# Patient Record
Sex: Female | Born: 1935 | Race: White | Hispanic: No | Smoking: Never smoker
Health system: Southern US, Community
[De-identification: ages and names within clinical notes are randomized; demographics above are authoritative.]

## PROBLEM LIST (undated history)

## (undated) DIAGNOSIS — F324 Major depressive disorder, single episode, in partial remission: Secondary | ICD-10-CM

## (undated) DIAGNOSIS — R531 Weakness: Secondary | ICD-10-CM

## (undated) DIAGNOSIS — Z950 Presence of cardiac pacemaker: Secondary | ICD-10-CM

## (undated) DIAGNOSIS — I10 Essential (primary) hypertension: Secondary | ICD-10-CM

## (undated) DIAGNOSIS — F0390 Unspecified dementia without behavioral disturbance: Secondary | ICD-10-CM

## (undated) DIAGNOSIS — R609 Edema, unspecified: Secondary | ICD-10-CM

## (undated) DIAGNOSIS — I4891 Unspecified atrial fibrillation: Secondary | ICD-10-CM

## (undated) HISTORY — DX: Unspecified dementia, unspecified severity, without behavioral disturbance, psychotic disturbance, mood disturbance, and anxiety: F03.90

## (undated) HISTORY — PX: GALLBLADDER SURGERY: SHX652

## (undated) HISTORY — DX: Unspecified atrial fibrillation: I48.91

## (undated) HISTORY — DX: Presence of cardiac pacemaker: Z95.0

## (undated) HISTORY — DX: Essential (primary) hypertension: I10

## (undated) HISTORY — DX: Edema, unspecified: R60.9

## (undated) HISTORY — PX: TONSILLECTOMY: SUR1361

## (undated) HISTORY — DX: Weakness: R53.1

## (undated) HISTORY — DX: Major depressive disorder, single episode, in partial remission: F32.4

## (undated) HISTORY — PX: PACEMAKER INSERTION: SHX728

---

## 2016-04-19 ENCOUNTER — Encounter: Payer: Self-pay | Admitting: Internal Medicine

## 2016-05-03 ENCOUNTER — Ambulatory Visit (INDEPENDENT_AMBULATORY_CARE_PROVIDER_SITE_OTHER): Payer: Medicare Other | Admitting: Internal Medicine

## 2016-05-03 ENCOUNTER — Encounter: Payer: Self-pay | Admitting: Internal Medicine

## 2016-05-03 VITALS — BP 162/100 | HR 77 | Ht 61.0 in | Wt 169.6 lb

## 2016-05-03 DIAGNOSIS — I480 Paroxysmal atrial fibrillation: Secondary | ICD-10-CM

## 2016-05-03 DIAGNOSIS — Z950 Presence of cardiac pacemaker: Secondary | ICD-10-CM

## 2016-05-03 DIAGNOSIS — I495 Sick sinus syndrome: Secondary | ICD-10-CM

## 2016-05-03 LAB — BASIC METABOLIC PANEL
BUN: 20 mg/dL (ref 7–25)
CALCIUM: 9.5 mg/dL (ref 8.6–10.4)
CHLORIDE: 100 mmol/L (ref 98–110)
CO2: 33 mmol/L — AB (ref 20–31)
CREATININE: 1.01 mg/dL — AB (ref 0.60–0.88)
Glucose, Bld: 143 mg/dL — ABNORMAL HIGH (ref 65–99)
Potassium: 3.9 mmol/L (ref 3.5–5.3)
SODIUM: 142 mmol/L (ref 135–146)

## 2016-05-03 NOTE — Patient Instructions (Addendum)
Medication Instructions:  Your physician recommends that you continue on your current medications as directed. Please refer to the Current Medication list given to you today.   Labwork: TODAY: BMP   Testing/Procedures: None Ordered   Follow-Up: Your physician wants you to follow-up in: 1 year with Dr. Ladona Ridgelaylor. You will receive a reminder letter in the mail two months in advance. If you don't receive a letter, please call our office to schedule the follow-up appointment.  Remote monitoring is used to monitor your ICD from home. This monitoring reduces the number of office visits required to check your device to one time per year. It allows us to keep an eye on the functioning of your device to ensure it is working properly. You are scheduled for a device check from home on 08/02/16. You may send your transmission at any time that day. If you have a wireless device, the transmission will be sent automatically. After your physician reviews your transmission, you will receive a postcard with your next transmission date.    Any Other Special Instructions Will Be Listed Below (If Applicable).     If you need a refill on your cardiac medications before your next appointment, please call your pharmacy.

## 2016-05-03 NOTE — Progress Notes (Signed)
HPI Gwendolyn Brown is referred today by Dr. Tenny Craw for ongoing evaluation and management of her PPM and atrial fib. She is a pleasant 82 yo woman with dementia, HTN, chronic atrial fib and complete heart block s/p PPM insertion. She also has some class 2 diastolic heart failure symptoms. No syncope. She is bothered by periodic epistaxis. Allergies  Allergen Reactions  . Codeine      Current Outpatient Prescriptions  Medication Sig Dispense Refill  . apixaban (ELIQUIS) 5 MG TABS tablet Take 5 mg by mouth 2 (two) times daily.    Marland Kitchen b complex vitamins tablet Take 1 tablet by mouth daily.    . metoprolol succinate (TOPROL-XL) 100 MG 24 hr tablet Take 100 mg by mouth 3 (three) times daily. Take 2 TABLET IN THE AM, TAKE 2 TABLET IN THE EVENING BY MOUTH.    . traZODone (DESYREL) 50 MG tablet Take 50 mg by mouth at bedtime.    . triamterene-hydrochlorothiazide (MAXZIDE-25) 37.5-25 MG tablet Take 1 tablet by mouth daily.    Marland Kitchen venlafaxine XR (EFFEXOR-XR) 37.5 MG 24 hr capsule Take 37.5 mg by mouth daily with breakfast.     No current facility-administered medications for this visit.      Past Medical History:  Diagnosis Date  . Atrial fibrillation (HCC)   . Dementia   . Depression, major, in partial remission (HCC)   . Edema   . Hypertension   . Pacemaker   . Weakness generalized     ROS:   All systems reviewed and negative except as noted in the HPI.   Past Surgical History:  Procedure Laterality Date  . GALLBLADDER SURGERY    . PACEMAKER INSERTION    . TONSILLECTOMY       Family History  Problem Relation Age of Onset  . Hypertension Father   . Hypertension Sister      Social History   Social History  . Marital status: Widowed    Spouse name: N/A  . Number of children: 2  . Years of education: COLLEGE   Occupational History  . RETIRED    Social History Main Topics  . Smoking status: Never Smoker  . Smokeless tobacco: Never Used  . Alcohol use Yes  . Drug  use: No  . Sexual activity: No   Other Topics Concern  . Not on file   Social History Narrative  . No narrative on file     BP (!) 162/100   Pulse 77   Physical Exam:  Well appearing elderly woman, a bit diskempt, NAD HEENT: Unremarkable Neck:  6 cm JVD, no thyromegally Lymphatics:  No adenopathy Back:  No CVA tenderness Lungs:  Clear with no wheezes HEART:  Regular rate rhythm, no murmurs, no rubs, no clicks Abd:  soft, positive bowel sounds, no organomegally, no rebound, no guarding Ext:  2 plus pulses, no edema, no cyanosis, no clubbing Skin:  No rashes no nodules Neuro:  CN II through XII intact, motor grossly intact  EKG - atrial fib with ventricular pacing  DEVICE  Normal device function.  See PaceArt for details. VVIR pacing  Assess/Plan: 1. Chronic atrial fib - her rates are well controlled. She is tolerating her anti-coagulation. 2. CHB - she is s/p PPM, and is asymptomatic 3. PPM - her medtronic device is reprogrammed today to VVIR as she has chronic atrial fib. 4. Diastolic heart failure - she is encouraged to maintain a low sodium diet. She keeps her legs elevated  to prevent edema formation.  Gwendolyn Brown,M.D.

## 2016-05-13 LAB — CUP PACEART INCLINIC DEVICE CHECK
Battery Impedance: 604 Ohm
Battery Voltage: 2.79 V
Brady Statistic RV Percent Paced: 100 %
Date Time Interrogation Session: 20170911143238
Implantable Lead Implant Date: 20130123
Implantable Lead Location: 753860
Lead Channel Impedance Value: 622 Ohm
Lead Channel Setting Pacing Amplitude: 2.5 V
Lead Channel Setting Pacing Pulse Width: 0.4 ms
Lead Channel Setting Sensing Sensitivity: 4 mV
MDC IDC LEAD IMPLANT DT: 20130123
MDC IDC LEAD LOCATION: 753859
MDC IDC MSMT BATTERY REMAINING LONGEVITY: 70 mo
MDC IDC MSMT LEADCHNL RV IMPEDANCE VALUE: 513 Ohm
MDC IDC MSMT LEADCHNL RV PACING THRESHOLD AMPLITUDE: 0.75 V
MDC IDC MSMT LEADCHNL RV PACING THRESHOLD PULSEWIDTH: 0.4 ms

## 2016-05-16 ENCOUNTER — Emergency Department (HOSPITAL_COMMUNITY): Payer: Medicare Other

## 2016-05-16 ENCOUNTER — Encounter (HOSPITAL_COMMUNITY): Payer: Self-pay | Admitting: Emergency Medicine

## 2016-05-16 ENCOUNTER — Inpatient Hospital Stay (HOSPITAL_COMMUNITY)
Admission: EM | Admit: 2016-05-16 | Discharge: 2016-05-21 | DRG: 194 | Disposition: A | Discharge status: Skilled Nursing Facility | Payer: Medicare Other | Attending: Internal Medicine | Admitting: Internal Medicine

## 2016-05-16 DIAGNOSIS — F324 Major depressive disorder, single episode, in partial remission: Secondary | ICD-10-CM | POA: Diagnosis present

## 2016-05-16 DIAGNOSIS — J189 Pneumonia, unspecified organism: Principal | ICD-10-CM | POA: Diagnosis present

## 2016-05-16 DIAGNOSIS — I272 Other secondary pulmonary hypertension: Secondary | ICD-10-CM | POA: Diagnosis present

## 2016-05-16 DIAGNOSIS — R8271 Bacteriuria: Secondary | ICD-10-CM

## 2016-05-16 DIAGNOSIS — F0390 Unspecified dementia without behavioral disturbance: Secondary | ICD-10-CM

## 2016-05-16 DIAGNOSIS — Z8249 Family history of ischemic heart disease and other diseases of the circulatory system: Secondary | ICD-10-CM

## 2016-05-16 DIAGNOSIS — R609 Edema, unspecified: Secondary | ICD-10-CM

## 2016-05-16 DIAGNOSIS — R062 Wheezing: Secondary | ICD-10-CM

## 2016-05-16 DIAGNOSIS — R262 Difficulty in walking, not elsewhere classified: Secondary | ICD-10-CM

## 2016-05-16 DIAGNOSIS — Z23 Encounter for immunization: Secondary | ICD-10-CM

## 2016-05-16 DIAGNOSIS — I159 Secondary hypertension, unspecified: Secondary | ICD-10-CM

## 2016-05-16 DIAGNOSIS — I69991 Dysphagia following unspecified cerebrovascular disease: Secondary | ICD-10-CM

## 2016-05-16 DIAGNOSIS — I495 Sick sinus syndrome: Secondary | ICD-10-CM

## 2016-05-16 DIAGNOSIS — R0600 Dyspnea, unspecified: Secondary | ICD-10-CM | POA: Diagnosis present

## 2016-05-16 DIAGNOSIS — I10 Essential (primary) hypertension: Secondary | ICD-10-CM

## 2016-05-16 DIAGNOSIS — R1313 Dysphagia, pharyngeal phase: Secondary | ICD-10-CM | POA: Diagnosis present

## 2016-05-16 DIAGNOSIS — Z66 Do not resuscitate: Secondary | ICD-10-CM | POA: Diagnosis present

## 2016-05-16 DIAGNOSIS — I4891 Unspecified atrial fibrillation: Secondary | ICD-10-CM

## 2016-05-16 DIAGNOSIS — B9620 Unspecified Escherichia coli [E. coli] as the cause of diseases classified elsewhere: Secondary | ICD-10-CM | POA: Diagnosis present

## 2016-05-16 DIAGNOSIS — I313 Pericardial effusion (noninflammatory): Secondary | ICD-10-CM | POA: Diagnosis present

## 2016-05-16 DIAGNOSIS — W19XXXA Unspecified fall, initial encounter: Secondary | ICD-10-CM | POA: Diagnosis present

## 2016-05-16 DIAGNOSIS — N390 Urinary tract infection, site not specified: Secondary | ICD-10-CM | POA: Diagnosis present

## 2016-05-16 DIAGNOSIS — E876 Hypokalemia: Secondary | ICD-10-CM | POA: Diagnosis present

## 2016-05-16 DIAGNOSIS — I160 Hypertensive urgency: Secondary | ICD-10-CM

## 2016-05-16 DIAGNOSIS — I482 Chronic atrial fibrillation: Secondary | ICD-10-CM | POA: Diagnosis present

## 2016-05-16 DIAGNOSIS — R296 Repeated falls: Secondary | ICD-10-CM | POA: Diagnosis present

## 2016-05-16 DIAGNOSIS — Z79899 Other long term (current) drug therapy: Secondary | ICD-10-CM

## 2016-05-16 DIAGNOSIS — Z950 Presence of cardiac pacemaker: Secondary | ICD-10-CM

## 2016-05-16 DIAGNOSIS — R531 Weakness: Secondary | ICD-10-CM | POA: Diagnosis present

## 2016-05-16 DIAGNOSIS — Z7901 Long term (current) use of anticoagulants: Secondary | ICD-10-CM

## 2016-05-16 DIAGNOSIS — R5381 Other malaise: Secondary | ICD-10-CM

## 2016-05-16 LAB — CBC WITH DIFFERENTIAL/PLATELET
Basophils Absolute: 0 10*3/uL (ref 0.0–0.1)
Basophils Relative: 0 %
EOS ABS: 0.1 10*3/uL (ref 0.0–0.7)
EOS PCT: 2 %
HCT: 43.1 % (ref 36.0–46.0)
HEMOGLOBIN: 13.8 g/dL (ref 12.0–15.0)
LYMPHS ABS: 1.2 10*3/uL (ref 0.7–4.0)
Lymphocytes Relative: 13 %
MCH: 28.5 pg (ref 26.0–34.0)
MCHC: 32 g/dL (ref 30.0–36.0)
MCV: 89 fL (ref 78.0–100.0)
MONOS PCT: 8 %
Monocytes Absolute: 0.7 10*3/uL (ref 0.1–1.0)
NEUTROS PCT: 77 %
Neutro Abs: 7.3 10*3/uL (ref 1.7–7.7)
Platelets: 275 10*3/uL (ref 150–400)
RBC: 4.84 MIL/uL (ref 3.87–5.11)
RDW: 16.4 % — ABNORMAL HIGH (ref 11.5–15.5)
WBC: 9.4 10*3/uL (ref 4.0–10.5)

## 2016-05-16 LAB — COMPREHENSIVE METABOLIC PANEL
ALK PHOS: 81 U/L (ref 38–126)
ALT: 23 U/L (ref 14–54)
ANION GAP: 11 (ref 5–15)
AST: 25 U/L (ref 15–41)
Albumin: 3.5 g/dL (ref 3.5–5.0)
BILIRUBIN TOTAL: 1.2 mg/dL (ref 0.3–1.2)
BUN: 14 mg/dL (ref 6–20)
CALCIUM: 9.3 mg/dL (ref 8.9–10.3)
CO2: 27 mmol/L (ref 22–32)
CREATININE: 0.85 mg/dL (ref 0.44–1.00)
Chloride: 101 mmol/L (ref 101–111)
Glucose, Bld: 134 mg/dL — ABNORMAL HIGH (ref 65–99)
Potassium: 3.8 mmol/L (ref 3.5–5.1)
SODIUM: 139 mmol/L (ref 135–145)
TOTAL PROTEIN: 6.6 g/dL (ref 6.5–8.1)

## 2016-05-16 LAB — BRAIN NATRIURETIC PEPTIDE: B NATRIURETIC PEPTIDE 5: 341.5 pg/mL — AB (ref 0.0–100.0)

## 2016-05-16 LAB — URINALYSIS, ROUTINE W REFLEX MICROSCOPIC
BILIRUBIN URINE: NEGATIVE
GLUCOSE, UA: NEGATIVE mg/dL
Ketones, ur: NEGATIVE mg/dL
Nitrite: POSITIVE — AB
Protein, ur: NEGATIVE mg/dL
SPECIFIC GRAVITY, URINE: 1.016 (ref 1.005–1.030)
pH: 5.5 (ref 5.0–8.0)

## 2016-05-16 LAB — URINE MICROSCOPIC-ADD ON

## 2016-05-16 MED ORDER — ONDANSETRON HCL 4 MG/2ML IJ SOLN: 4.0000 mg | Freq: Four times a day (QID) | INTRAMUSCULAR | Status: DC | PRN

## 2016-05-16 MED ORDER — TRAZODONE HCL 50 MG PO TABS
50.0000 mg | ORAL_TABLET | Freq: Every day | ORAL | Status: DC
  Administered 2016-05-16 – 2016-05-20 (×5): 50 mg via ORAL
  Filled 2016-05-16 (×6): qty 1

## 2016-05-16 MED ORDER — SODIUM CHLORIDE 0.9 % IV SOLN: 250.0000 mL | INTRAVENOUS | Status: DC | PRN

## 2016-05-16 MED ORDER — DOCUSATE SODIUM 100 MG PO CAPS: 100.0000 mg | ORAL_CAPSULE | Freq: Every day | ORAL | Status: DC | PRN

## 2016-05-16 MED ORDER — VENLAFAXINE HCL ER 37.5 MG PO CP24
37.5000 mg | ORAL_CAPSULE | Freq: Every day | ORAL | Status: DC
  Administered 2016-05-17 – 2016-05-21 (×5): 37.5 mg via ORAL
  Filled 2016-05-16 (×5): qty 1

## 2016-05-16 MED ORDER — OXYCODONE HCL 5 MG PO TABS
5.0000 mg | ORAL_TABLET | ORAL | Status: DC | PRN
  Administered 2016-05-18: 5 mg via ORAL
  Filled 2016-05-16: qty 1

## 2016-05-16 MED ORDER — TRIAMTERENE-HCTZ 37.5-25 MG PO TABS
1.0000 | ORAL_TABLET | Freq: Every day | ORAL | Status: DC
  Administered 2016-05-17 – 2016-05-21 (×5): 1 via ORAL
  Filled 2016-05-16 (×5): qty 1

## 2016-05-16 MED ORDER — FUROSEMIDE 10 MG/ML IJ SOLN
40.0000 mg | Freq: Every day | INTRAMUSCULAR | Status: DC
  Administered 2016-05-16 – 2016-05-20 (×5): 40 mg via INTRAVENOUS
  Filled 2016-05-16 (×6): qty 4

## 2016-05-16 MED ORDER — SODIUM CHLORIDE 0.9% FLUSH
3.0000 mL | Freq: Two times a day (BID) | INTRAVENOUS | Status: DC
  Administered 2016-05-16 – 2016-05-21 (×8): 3 mL via INTRAVENOUS

## 2016-05-16 MED ORDER — SODIUM CHLORIDE 0.9% FLUSH: 3.0000 mL | INTRAVENOUS | Status: DC | PRN

## 2016-05-16 MED ORDER — LISINOPRIL 5 MG PO TABS
5.0000 mg | ORAL_TABLET | Freq: Every day | ORAL | Status: DC
  Administered 2016-05-16 – 2016-05-20 (×5): 5 mg via ORAL
  Filled 2016-05-16 (×5): qty 1

## 2016-05-16 MED ORDER — INFLUENZA VAC SPLIT QUAD 0.5 ML IM SUSY
0.5000 mL | PREFILLED_SYRINGE | INTRAMUSCULAR | Status: AC
  Administered 2016-05-17: 0.5 mL via INTRAMUSCULAR
  Filled 2016-05-16: qty 0.5

## 2016-05-16 MED ORDER — ACETAMINOPHEN 325 MG PO TABS
650.0000 mg | ORAL_TABLET | ORAL | Status: DC | PRN
  Administered 2016-05-18: 650 mg via ORAL
  Filled 2016-05-16: qty 2

## 2016-05-16 MED ORDER — METOPROLOL SUCCINATE ER 50 MG PO TB24
200.0000 mg | ORAL_TABLET | Freq: Two times a day (BID) | ORAL | Status: DC
  Administered 2016-05-16 – 2016-05-21 (×10): 200 mg via ORAL
  Filled 2016-05-16 (×10): qty 4

## 2016-05-16 MED ORDER — APIXABAN 5 MG PO TABS
5.0000 mg | ORAL_TABLET | Freq: Two times a day (BID) | ORAL | Status: DC
  Administered 2016-05-16 – 2016-05-21 (×10): 5 mg via ORAL
  Filled 2016-05-16 (×10): qty 1

## 2016-05-16 NOTE — ED Triage Notes (Signed)
Per EMS-states she fell in bathroom this am and couldn't get up-states increased weakness for past 3 days-states saw cardiologist last week and had pacemaker checked

## 2016-05-16 NOTE — H&P (Signed)
History and Physical    Gwendolyn Brown:[id number] DOB: 1930-07-29 DOA: 05/16/2016  PCP:  Duane Lope, MD Patient coming from: Home  Chief Complaint: Dyspnea  HPI: Gwendolyn Brown is a 82 y.o. female with medical history significant of hypertension, atrial fibrillation on chronic anticoagulation, sick sinus syndrome status post pacer, dementia, depression. About 4 days ago, patient started having mild to moderate dyspnea. She has had associated nonproductive coughing and increasing weakness. She has not taken anything to help with her symptoms. Dyspnea continued to worsen, in addition to patient's increasing weakness, so EMS was called for transport to the [facility] department.  ED Course: Vitals: Hypertensive with systolic blood pressures in 200s over as THE 100s Labs: Urinalysis suggest bacteria. BNP elevated at 341. Imaging: Chest x-ray was concerning for possible pneumonia but more likely low lung volumes Medications/Course: EKG obtained in the ED showing a paced rhythm. No other interventions done.  Review of Systems: Review of Systems  Constitutional: Negative for chills, fever and malaise/fatigue.  Eyes: Negative for blurred vision.  Respiratory: Positive for cough, shortness of breath and wheezing. Negative for hemoptysis and sputum production.   Cardiovascular: Negative for chest pain, palpitations, orthopnea, leg swelling and PND.  Gastrointestinal: Negative for abdominal pain, constipation, diarrhea, heartburn, nausea and vomiting.  Genitourinary: Negative for dysuria, flank pain, frequency, hematuria and urgency.  Neurological: Positive for weakness. Negative for loss of consciousness and headaches.  All other systems reviewed and are negative.   Past Medical History:  Diagnosis Date  . Atrial fibrillation (HCC)   . Dementia   . Depression, major, in partial remission (HCC)   . Edema   . Hypertension   . Pacemaker   . Weakness generalized     Past Surgical History:    Procedure Laterality Date  . GALLBLADDER SURGERY    . PACEMAKER INSERTION    . TONSILLECTOMY       reports that she has never smoked. She has never used smokeless tobacco. She reports that she drinks alcohol. She reports that she does not use drugs.  Allergies  Allergen Reactions  . Codeine Rash    Family History  Problem Relation Age of Onset  . Hypertension Father   . Hypertension Sister     Prior to Admission medications   Medication Sig Start Date End Date Taking? Authorizing Provider  acetaminophen (TYLENOL) 500 MG tablet Take 500 mg by mouth every 6 (six) hours as needed for mild pain.   Yes Historical Provider, MD  apixaban (ELIQUIS) 5 MG TABS tablet Take 5 mg by mouth 2 (two) times daily.   Yes Historical Provider, MD  b complex vitamins tablet Take 1 tablet by mouth daily.   Yes Historical Provider, MD  docusate sodium (COLACE) 100 MG capsule Take 100 mg by mouth daily as needed for mild constipation.   Yes Historical Provider, MD  metoprolol succinate (TOPROL-XL) 100 MG 24 hr tablet Take 100 mg by mouth 3 (three) times daily. Take 2 TABLET IN THE AM, TAKE 2 TABLET IN THE EVENING BY MOUTH.   Yes Historical Provider, MD  oxyCODONE (OXY IR/ROXICODONE) 5 MG immediate release tablet Take 5 mg by mouth every 4 (four) hours as needed for severe pain or breakthrough pain.   Yes Historical Provider, MD  traZODone (DESYREL) 50 MG tablet Take 50 mg by mouth at bedtime.   Yes Historical Provider, MD  triamterene-hydrochlorothiazide (MAXZIDE-25) 37.5-25 MG tablet Take 1 tablet by mouth daily.   Yes Historical Provider, MD  venlafaxine XR (EFFEXOR-XR)  37.5 MG 24 hr capsule Take 37.5 mg by mouth daily with breakfast.   Yes Historical Provider, MD    Physical Exam: Vitals:   05/16/16 1000 05/16/16 1030 05/16/16 1039 05/16/16 1100  BP: (!) 203/109 (!) 199/113 (!) 199/113 (!) 205/103  Pulse:  79 74 71  Resp: 21 18 24 24   Temp:      SpO2:  95% 98% 92%  Weight:      Height:          Constitutional: NAD, calm, comfortable Eyes: PERRL, lids and conjunctivae normal ENMT: Mucous membranes are moist. Posterior pharynx clear of any exudate or lesions. Poor dentition.  Neck: normal, supple, no masses, no thyromegaly Respiratory: Diminished breath sounds bilaterally with crackles heard. Normal respiratory effort. No accessory muscle use.  Cardiovascular: Regular rate and rhythm, no murmurs / rubs / gallops. 2+ extremity edema. 2+ pedal pulses. No carotid bruits.  Abdomen: no tenderness, no masses palpated. No hepatosplenomegaly. Bowel sounds positive.  Musculoskeletal: no clubbing / cyanosis. No joint deformity upper and lower extremities. Good ROM, no contractures. Normal muscle tone.  Skin: small ecchymotic rashes on arms, no cyanosis Neurologic: CN 2-12 grossly intact. Sensation intact, DTR diminished bilaterally. Strength 4/5 in all 4.  Psychiatric: Normal judgment and insight. Alert and oriented x 3. Normal mood.   Labs on Admission: I have personally reviewed following labs and imaging studies  CBC:  Recent Labs Lab 05/16/16 0839  WBC 9.4  NEUTROABS 7.3  HGB 13.8  HCT 43.1  MCV 89.0  PLT 275   Basic Metabolic Panel:  Recent Labs Lab 05/16/16 0839  NA 139  K 3.8  CL 101  CO2 27  GLUCOSE 134*  BUN 14  CREATININE 0.85  CALCIUM 9.3   GFR: Estimated Creatinine Clearance: 52 mL/min (by C-G formula based on SCr of 0.85 mg/dL). Liver Function Tests:  Recent Labs Lab 05/16/16 0839  AST 25  ALT 23  ALKPHOS 81  BILITOT 1.2  PROT 6.6  ALBUMIN 3.5   Urine analysis:    Component Value Date/Time   COLORURINE YELLOW 05/16/2016 0810   APPEARANCEUR CLOUDY (A) 05/16/2016 0810   LABSPEC 1.016 05/16/2016 0810   PHURINE 5.5 05/16/2016 0810   GLUCOSEU NEGATIVE 05/16/2016 0810   HGBUR MODERATE (A) 05/16/2016 0810   BILIRUBINUR NEGATIVE 05/16/2016 0810   KETONESUR NEGATIVE 05/16/2016 0810   PROTEINUR NEGATIVE 05/16/2016 0810   NITRITE POSITIVE (A)  05/16/2016 0810   LEUKOCYTESUR MODERATE (A) 05/16/2016 0810   Radiological Exams on Admission: Dg Chest 2 View  Result Date: 05/16/2016 CLINICAL DATA:  Patient fell this morning. Complaining of of increased weakness and dyspnea for the past 3 days. EXAM: CHEST  2 VIEW COMPARISON:  02/23/2016 FINDINGS: Lung volumes are low suggesting shallow inspiration. Crowding of the interstitial lung markings and bibasilar atelectasis result. Slight increasing airspace opacity at the right lung base cannot entirely exclude a small infiltrate. No overt pulmonary edema nor effusion. The heart is normal in size. Left-sided pacemaker apparatus projects over the costophrenic angle with right atrial and right ventricular leads in place. Aorta is not aneurysmal. There is osteoarthritic joint space narrowing of both glenohumeral joints with inferomedial osteophytes off the humeral heads. Dystrophic calcifications are seen along the course of the rotator cuff bilaterally. IMPRESSION: Confluent area of airspace opacity at the right lung base for which a small pneumonia cannot be entirely excluded but findings are believed to be more likely from shallow inspiration and low lung volumes. Bilateral calcific rotator cuff tendinopathy.  Electronically Signed   By: Tollie Eth M.D.   On: 05/16/2016 08:38    EKG: Independently reviewed. Ventricular-paced  Assessment/Plan Principal Problem:   Dyspnea Active Problems:   Atrial fibrillation (HCC)   Dementia   Pacemaker   Hypertension   Sick sinus syndrome (HCC)   Hypertensive urgency   Asymptomatic bacteriuria  Dyspnea Weaning towards heart failure exacerbation. Chest x-ray not very significant for a pneumonia. No associated chest pain. PE not time differential, especially since patient is on chronic anticoagulation. Patient is up 11 pounds from recent cardiologist visit 2 weeks ago. BNP is elevated. -Lasix 40 mg IV daily -Daily weights, strict ins and outs -Will start  lisinopril 5 mg daily. We'll need to watch potassium as patient is on a potassium sparing diuretic -Continue metoprolol -Echocardiogram -TSH  Hypertensive urgency Essential Hypertension Hypervolemic state could be contributing to patient's blood pressure. Patient has taken her blood pressure medications today -Continue metoprolol and Maxzide -Lisinopril as above -Diuresis as above  Atrial fibrillation -Continue Eliquis -Continue metoprolol  Sick sinus syndrome Patient currently has pacer  Depression Continue Effexor  Dementia -Watch for behavioral changes  Asymptomatically bacteriuria -Obtain urine culture -Hold off on antibiotic therapy  DVT prophylaxis: Eliquis  Code Status: DO NOT RESUSCITATE  Family Communication: Daughter at bedside  Disposition Plan: Likely discharge home in 2-3 days  Consults called: None  Admission status: Inpatient, telemetry   Jacquelin Hawking, MD [facility] Pager [phone]  If 7PM-7AM, please contact night-coverage [email or web address] Password [email or web address]  05/16/2016, 11:58 AM

## 2016-05-16 NOTE — ED Provider Notes (Signed)
[facility]-EMERGENCY DEPT Provider Note   CSN: [id number] Arrival date & time: 05/16/16  0751     History   Chief Complaint Chief Complaint  Patient presents with  . Shortness of Breath    HPI Gwendolyn Brown is a 82 y.o. female.  She presents for evaluation of shortness of breath. She is unable stay, the severity, or how long it's been present.  Level V caveat- poor historian  HPI  Past Medical History:  Diagnosis Date  . Atrial fibrillation (HCC)   . Dementia   . Depression, major, in partial remission (HCC)   . Edema   . Hypertension   . Pacemaker   . Weakness generalized     There are no active problems to display for this patient.   Past Surgical History:  Procedure Laterality Date  . GALLBLADDER SURGERY    . PACEMAKER INSERTION    . TONSILLECTOMY      OB History    No data available       Home Medications    Prior to Admission medications   Medication Sig Start Date End Date Taking? Authorizing Provider  apixaban (ELIQUIS) 5 MG TABS tablet Take 5 mg by mouth 2 (two) times daily.    Historical Provider, MD  b complex vitamins tablet Take 1 tablet by mouth daily.    Historical Provider, MD  metoprolol succinate (TOPROL-XL) 100 MG 24 hr tablet Take 100 mg by mouth 3 (three) times daily. Take 2 TABLET IN THE AM, TAKE 2 TABLET IN THE EVENING BY MOUTH.    Historical Provider, MD  traZODone (DESYREL) 50 MG tablet Take 50 mg by mouth at bedtime.    Historical Provider, MD  triamterene-hydrochlorothiazide (MAXZIDE-25) 37.5-25 MG tablet Take 1 tablet by mouth daily.    Historical Provider, MD  venlafaxine XR (EFFEXOR-XR) 37.5 MG 24 hr capsule Take 37.5 mg by mouth daily with breakfast.    Historical Provider, MD    Family History Family History  Problem Relation Age of Onset  . Hypertension Father   . Hypertension Sister     Social History Social History  Substance Use Topics  . Smoking status: Never Smoker  . Smokeless tobacco: Never Used  . Alcohol use  Yes     Allergies   Codeine   Review of Systems Review of Systems  Unable to perform ROS: Mental status change     Physical Exam Updated Vital Signs There were no vitals taken for this visit.  Physical Exam  Constitutional: She appears well-developed.  Elderly, frail  HENT:  Head: Normocephalic and atraumatic.  Eyes: Conjunctivae and EOM are normal. Pupils are equal, round, and reactive to light.  Neck: Normal range of motion and phonation normal. Neck supple.  Cardiovascular: Normal rate and regular rhythm.   No JVD.  Pulmonary/Chest: Effort normal. She exhibits no tenderness.  Somewhat diminished air movement bilaterally with scattered end-expiratory wheezes. Breath sounds diminished at the bases.  Abdominal: Soft. She exhibits no distension. There is no tenderness. There is no guarding.  Musculoskeletal: Normal range of motion. She exhibits edema (1-2+ bilateral lower legs.).  Neurological: She is alert. No cranial nerve deficit. She exhibits normal muscle tone.  Skin: Skin is warm and dry.  Psychiatric: She has a normal mood and affect. Her behavior is normal.  Nursing note and vitals reviewed.    ED Treatments / Results  Labs (all labs ordered are listed, but only abnormal results are displayed) Labs Reviewed  CBC WITH DIFFERENTIAL/PLATELET  URINALYSIS, ROUTINE  W REFLEX MICROSCOPIC (NOT AT [facility])  COMPREHENSIVE METABOLIC PANEL  BRAIN NATRIURETIC PEPTIDE    EKG  EKG Interpretation None       Radiology No results found.  Procedures Procedures (including critical care time)  Medications Ordered in ED Medications - No data to display   Initial Impression / Assessment and Plan / ED Course  I have reviewed the triage vital signs and the nursing notes.  Pertinent labs & imaging results that were available during my care of the patient were reviewed by me and considered in my medical decision making (see chart for details).  Clinical Course  Value  Comment By Time   The patient's daughter is here now, with whom she lives. She gives an extensive description of the patient's decline over the last 6 months. This decline has resulted in her falling from a chair twice within the last week. Each time she required assistance to get up, but there was no injury. The patient has been receiving PT and OT, at her home, and they plan on discharging her next week. The daughter states that the patient has been increasingly short of breath over the last 3 weeks, has an occasional cough, and is having more swelling in her legs. The patient is eating well. Reportedly, the patient has blood pressure is ranging between 118 and 200. The patient is DO NOT RESUSCITATE. The patient is having "cognition problems", described as difficulty initiating movement, confusion, inability to help herself. She is able to walk and uses various assistive devices, including canes and a walker. The patient is reportedly taking her medications as prescribed. She saw a urgent care physician, 5 days ago for shortness of breath and was told to work on coughing to clear her lungs. She follows closely with her PCP and cardiology services. The patient has not had any urinary frequency, dysuria or hematuria by report. The daughter states the patient cannot be managed at her current living setting, because of her weakness and falling and progressive shortness of breath. The patient's daughter admits that she would consider placing the patient and a "memory care unit or hospice". Mancel Bale, MD 09/24 1038  Nitrite: (!) POSITIVE Consistent with UTI Mancel Bale, MD 09/24 1043    Medications - No data to display  Patient Vitals for the past 24 hrs:  BP Temp Pulse Resp SpO2 Height Weight  05/16/16 1039 (!) 199/113 - 74 24 98 % - -  05/16/16 1000 (!) 203/109 - - 21 - - -  05/16/16 0930 (!) 191/120 - - (!) 28 - - -  05/16/16 0907 - - - - 96 % - -  05/16/16 0900 (!) 216/103 - - (!) 32 - - -    05/16/16 0830 (!) 203/100 - 69 19 93 % - -  05/16/16 0826 (!) 213/103 97.9 F (36.6 C) 73 21 90 % 5\' 5"  (1.651 m) 180 lb (81.6 kg)    10:40 AM Reevaluation with update and discussion. After initial assessment and treatment, an updated evaluation reveals no change in clinical status. Patient family updated on findings.Mancel Bale L    10:47 AM-Consult complete with hospitalist. Patient case explained and discussed. Hospitalist agrees to admit patient for further evaluation and treatment. Call ended at 11:10  Final Clinical Impressions(s) / ED Diagnoses   Final diagnoses:  Weakness  UTI (lower urinary tract infection)  Fall, initial encounter  Secondary hypertension, unspecified  Malaise  Dyspnea  Wheezing  Edema, peripheral    Weakness with multiple  falls. Hypertension, despite treatment. Likely urinary tract infection, contributing to weakness. No overt signs of cystitis. Doubt pyelonephritis, sepsis, metabolic instability. Possible pneumonia, causing shortness of breath and wheezing. Cannot clinically rule out pulmonary embolus. Patient has a risk for further decompensation, issues, discharge. Therefore, she will be admitted for observation, and further evaluation.  Nursing Notes Reviewed/ Care Coordinated, and agree without changes. Applicable Imaging Reviewed.  Interpretation of Laboratory Data incorporated into ED treatment  Plan: Admit  New Prescriptions New Prescriptions   No medications on file     Mancel BaleElliott Kaylaann Mountz, MD 05/16/16 1507

## 2016-05-16 NOTE — ED Notes (Signed)
Bed: WA06 Expected date: 05/16/16 Expected time: 7:40 AM Means of arrival:  Comments: 82 yo Weakness

## 2016-05-17 ENCOUNTER — Observation Stay (HOSPITAL_COMMUNITY): Payer: Medicare Other

## 2016-05-17 DIAGNOSIS — Z79899 Other long term (current) drug therapy: Secondary | ICD-10-CM | POA: Diagnosis not present

## 2016-05-17 DIAGNOSIS — I10 Essential (primary) hypertension: Secondary | ICD-10-CM | POA: Diagnosis not present

## 2016-05-17 DIAGNOSIS — I160 Hypertensive urgency: Secondary | ICD-10-CM | POA: Diagnosis present

## 2016-05-17 DIAGNOSIS — N390 Urinary tract infection, site not specified: Secondary | ICD-10-CM | POA: Diagnosis present

## 2016-05-17 DIAGNOSIS — I313 Pericardial effusion (noninflammatory): Secondary | ICD-10-CM | POA: Diagnosis present

## 2016-05-17 DIAGNOSIS — I480 Paroxysmal atrial fibrillation: Secondary | ICD-10-CM | POA: Diagnosis not present

## 2016-05-17 DIAGNOSIS — B9620 Unspecified Escherichia coli [E. coli] as the cause of diseases classified elsewhere: Secondary | ICD-10-CM | POA: Diagnosis present

## 2016-05-17 DIAGNOSIS — Z950 Presence of cardiac pacemaker: Secondary | ICD-10-CM | POA: Diagnosis not present

## 2016-05-17 DIAGNOSIS — Z66 Do not resuscitate: Secondary | ICD-10-CM | POA: Diagnosis present

## 2016-05-17 DIAGNOSIS — Z23 Encounter for immunization: Secondary | ICD-10-CM | POA: Diagnosis not present

## 2016-05-17 DIAGNOSIS — R8271 Bacteriuria: Secondary | ICD-10-CM | POA: Diagnosis not present

## 2016-05-17 DIAGNOSIS — J189 Pneumonia, unspecified organism: Secondary | ICD-10-CM | POA: Diagnosis present

## 2016-05-17 DIAGNOSIS — W19XXXA Unspecified fall, initial encounter: Secondary | ICD-10-CM | POA: Diagnosis present

## 2016-05-17 DIAGNOSIS — E876 Hypokalemia: Secondary | ICD-10-CM | POA: Diagnosis present

## 2016-05-17 DIAGNOSIS — I509 Heart failure, unspecified: Secondary | ICD-10-CM | POA: Diagnosis not present

## 2016-05-17 DIAGNOSIS — R0600 Dyspnea, unspecified: Secondary | ICD-10-CM | POA: Diagnosis present

## 2016-05-17 DIAGNOSIS — Z8249 Family history of ischemic heart disease and other diseases of the circulatory system: Secondary | ICD-10-CM | POA: Diagnosis not present

## 2016-05-17 DIAGNOSIS — Z7901 Long term (current) use of anticoagulants: Secondary | ICD-10-CM | POA: Diagnosis not present

## 2016-05-17 DIAGNOSIS — I272 Other secondary pulmonary hypertension: Secondary | ICD-10-CM | POA: Diagnosis present

## 2016-05-17 DIAGNOSIS — F324 Major depressive disorder, single episode, in partial remission: Secondary | ICD-10-CM | POA: Diagnosis present

## 2016-05-17 DIAGNOSIS — F0390 Unspecified dementia without behavioral disturbance: Secondary | ICD-10-CM | POA: Diagnosis present

## 2016-05-17 DIAGNOSIS — I4891 Unspecified atrial fibrillation: Secondary | ICD-10-CM | POA: Diagnosis not present

## 2016-05-17 DIAGNOSIS — R531 Weakness: Secondary | ICD-10-CM | POA: Diagnosis present

## 2016-05-17 DIAGNOSIS — R1313 Dysphagia, pharyngeal phase: Secondary | ICD-10-CM | POA: Diagnosis present

## 2016-05-17 DIAGNOSIS — R296 Repeated falls: Secondary | ICD-10-CM | POA: Diagnosis present

## 2016-05-17 DIAGNOSIS — I482 Chronic atrial fibrillation: Secondary | ICD-10-CM | POA: Diagnosis present

## 2016-05-17 LAB — BASIC METABOLIC PANEL
Anion gap: 10 (ref 5–15)
BUN: 13 mg/dL (ref 6–20)
CHLORIDE: 97 mmol/L — AB (ref 101–111)
CO2: 31 mmol/L (ref 22–32)
Calcium: 8.9 mg/dL (ref 8.9–10.3)
Creatinine, Ser: 0.72 mg/dL (ref 0.44–1.00)
GFR calc Af Amer: >60 mL/min (ref >60)
GFR calc non Af Amer: >60 mL/min (ref >60)
GLUCOSE: 130 mg/dL — AB (ref 65–99)
POTASSIUM: 3.2 mmol/L — AB (ref 3.5–5.1)
SODIUM: 138 mmol/L (ref 135–145)

## 2016-05-17 LAB — TSH: TSH: 0.899 u[IU]/mL (ref 0.350–4.500)

## 2016-05-17 MED ORDER — POTASSIUM CHLORIDE CRYS ER 20 MEQ PO TBCR
40.0000 meq | EXTENDED_RELEASE_TABLET | Freq: Once | ORAL | Status: AC
  Administered 2016-05-17: 40 meq via ORAL
  Filled 2016-05-17: qty 2

## 2016-05-17 MED ORDER — HYDRALAZINE HCL 20 MG/ML IJ SOLN
10.0000 mg | Freq: Four times a day (QID) | INTRAMUSCULAR | Status: DC | PRN
  Administered 2016-05-17: 10 mg via INTRAVENOUS
  Filled 2016-05-17 (×2): qty 1

## 2016-05-17 NOTE — Care Management Obs Status (Signed)
MEDICARE OBSERVATION STATUS NOTIFICATION   Patient Details  Name: Gwendolyn Brown MRN: [id number] Date of Birth: 08/05/1930   Medicare Observation Status Notification Given:  Yes    Golda AcreDavis, Kemia Wendel Lynn, RN 05/17/2016, 3:45 PM

## 2016-05-17 NOTE — Progress Notes (Signed)
PROGRESS NOTE    Laurren Lepkowski  ZOX:[id number] DOB: [birth year] DOA: 05/16/2016 PCP:  Duane Lope, MD   Chief Complaint  Patient presents with  . Shortness of Breath    Brief Narrative:  HPI on 05/16/2016 by Dr. Jacquelin Hawking Marciana Uplinger is a 82 y.o. female with medical history significant of hypertension, atrial fibrillation on chronic anticoagulation, sick sinus syndrome status post pacer, dementia, depression. About 4 days ago, patient started having mild to moderate dyspnea. She has had associated nonproductive coughing and increasing weakness. She has not taken anything to help with her symptoms. Dyspnea continued to worsen, in addition to patient's increasing weakness, so EMS was called for transport to the [facility] department.  Assessment & Plan   Dyspnea/Weight Gain -CXR Confluent area of airspace opacity at the right lung base, pneumonia cannot be excluded. -BNP 341 -Echocardiogram pending -Continue IV lasix -Monitor daily weights, intake/output  Accelerated Hypertension/Essential hypertension,uncontrolled -Continue Lasix, lisinopril, metoprolol -Will add hydralazine PRN  Atrial fibrillation -CHADSVASC -Continue Eliquis  Sick sinus syndrome -has pacemaker  -Sees Dr. Sharrell Ku  Depression -Continue Effexor  Dementia -Stable  Asymptomatic bacteriuria -UA showed many bacteria, 6:30 WBC, moderate leukocytes, positive nitrites -Pending urine culture -Antibiotics held at this time -patient currently afebrile, no leukocytosis  Dysphagia -Patient has seen speech as an outpatient -Was supposed to have a modified barium swallow, will order  Deconditioning -PT and OT consulted -patient has had PT/OT as an outpatient  Hypokalemia -Likely secondary to diuresis -Will replace and continue to monitor BMP  DVT Prophylaxis  Eliquis  Code Status: DO NOT RESUSCITATE  Family Communication: Family bedside  Disposition Plan: Observation. Pending echocardiogram. Continue  treatment for suspected heart failure  Consultants None  Procedures  None  Antibiotics   Anti-infectives    None      Subjective:   Kenzi Bardwell seen and examined today.  Patient feel shortness of breath has mildly improved.  Denies chest pain, abdominal pain, N/V/D/C, headache, dizziness. Complains of knee pain.   Objective:   Vitals:   05/16/16 1214 05/16/16 2104 05/16/16 2300 05/17/16 0543  BP: (!) 182/94 (!) 203/122 (!) 179/112 (!) 199/82  Pulse: 74 76  70  Resp: 20 19  20   Temp: 97.6 F (36.4 C) 98.3 F (36.8 C)  98.4 F (36.9 C)  TempSrc: Oral Oral  Oral  SpO2: 95% 98%  95%  Weight: 81.3 kg (179 lb 3.7 oz)     Height: 5\' 1"  (1.549 m)       Intake/Output Summary (Last 24 hours) at 05/17/16 1240 Last data filed at 05/17/16 0913  Gross per 24 hour  Intake              720 ml  Output              900 ml  Net             -180 ml   Filed Weights   05/16/16 0826 05/16/16 1214  Weight: 81.6 kg (180 lb) 81.3 kg (179 lb 3.7 oz)    Exam  General: Well developed, well nourished, NAD, appears stated age  HEENT: NCAT, mucous membranes moist.   Cardiovascular: S1 S2 auscultated, irregular  Respiratory: Diffuse expiratory wheezing  Abdomen: Soft, nontender, nondistended, + bowel sounds  Extremities: Lower extremity edema  Neuro: AAOx3 (person, place, events), nonfocal  Psych: Normal affect and demeanor with intact judgement and insight   Data Reviewed: I have personally reviewed following labs and imaging studies  CBC:  Recent Labs Lab 05/16/16 0839  WBC 9.4  NEUTROABS 7.3  HGB 13.8  HCT 43.1  MCV 89.0  PLT 275   Basic Metabolic Panel:  Recent Labs Lab 05/16/16 0839 05/17/16 0519  NA 139 138  K 3.8 3.2*  CL 101 97*  CO2 27 31  GLUCOSE 134* 130*  BUN 14 13  CREATININE 0.85 0.72  CALCIUM 9.3 8.9   GFR: Estimated Creatinine Clearance: 50.6 mL/min (by C-G formula based on SCr of 0.72 mg/dL). Liver Function Tests:  Recent Labs Lab  05/16/16 0839  AST 25  ALT 23  ALKPHOS 81  BILITOT 1.2  PROT 6.6  ALBUMIN 3.5   No results for input(s): LIPASE, AMYLASE in the last 168 hours. No results for input(s): AMMONIA in the last 168 hours. Coagulation Profile: No results for input(s): INR, PROTIME in the last 168 hours. Cardiac Enzymes: No results for input(s): CKTOTAL, CKMB, CKMBINDEX, TROPONINI in the last 168 hours. BNP (last 3 results) No results for input(s): PROBNP in the last 8760 hours. HbA1C: No results for input(s): HGBA1C in the last 72 hours. CBG: No results for input(s): GLUCAP in the last 168 hours. Lipid Profile: No results for input(s): CHOL, HDL, LDLCALC, TRIG, CHOLHDL, LDLDIRECT in the last 72 hours. Thyroid Function Tests:  Recent Labs  05/17/16 0519  TSH 0.899   Anemia Panel: No results for input(s): VITAMINB12, FOLATE, FERRITIN, TIBC, IRON, RETICCTPCT in the last 72 hours. Urine analysis:    Component Value Date/Time   COLORURINE YELLOW 05/16/2016 0810   APPEARANCEUR CLOUDY (A) 05/16/2016 0810   LABSPEC 1.016 05/16/2016 0810   PHURINE 5.5 05/16/2016 0810   GLUCOSEU NEGATIVE 05/16/2016 0810   HGBUR MODERATE (A) 05/16/2016 0810   BILIRUBINUR NEGATIVE 05/16/2016 0810   KETONESUR NEGATIVE 05/16/2016 0810   PROTEINUR NEGATIVE 05/16/2016 0810   NITRITE POSITIVE (A) 05/16/2016 0810   LEUKOCYTESUR MODERATE (A) 05/16/2016 0810   Sepsis Labs: @LABRCNTIP (procalcitonin:4,lacticidven:4)  )No results found for this or any previous visit (from the past 240 hour(s)).    Radiology Studies: Dg Chest 2 View  Result Date: 05/16/2016 CLINICAL DATA:  Patient fell this morning. Complaining of of increased weakness and dyspnea for the past 3 days. EXAM: CHEST  2 VIEW COMPARISON:  02/23/2016 FINDINGS: Lung volumes are low suggesting shallow inspiration. Crowding of the interstitial lung markings and bibasilar atelectasis result. Slight increasing airspace opacity at the right lung base cannot entirely  exclude a small infiltrate. No overt pulmonary edema nor effusion. The heart is normal in size. Left-sided pacemaker apparatus projects over the costophrenic angle with right atrial and right ventricular leads in place. Aorta is not aneurysmal. There is osteoarthritic joint space narrowing of both glenohumeral joints with inferomedial osteophytes off the humeral heads. Dystrophic calcifications are seen along the course of the rotator cuff bilaterally. IMPRESSION: Confluent area of airspace opacity at the right lung base for which a small pneumonia cannot be entirely excluded but findings are believed to be more likely from shallow inspiration and low lung volumes. Bilateral calcific rotator cuff tendinopathy. Electronically Signed   By: Tollie Ethavid  Kwon M.D.   On: 05/16/2016 08:38     Scheduled Meds: . apixaban  5 mg Oral BID  . furosemide  40 mg Intravenous Daily  . lisinopril  5 mg Oral Daily  . metoprolol succinate  200 mg Oral BID  . sodium chloride flush  3 mL Intravenous Q12H  . traZODone  50 mg Oral QHS  . triamterene-hydrochlorothiazide  1 tablet Oral Daily  .  venlafaxine XR  37.5 mg Oral Q breakfast   Continuous Infusions:    LOS: 0 days   Time Spent in minutes   30 minutes  Lyon Dumont D.O. on 05/17/2016 at 12:40 PM  Between 7am to 7pm - Pager - [phone]  After 7pm go to [email or web address] - password [email or web address]  And look for the night coverage person covering for me after hours  [facility] Office  [phone]

## 2016-05-17 NOTE — Evaluation (Signed)
Physical Therapy Evaluation Patient Details Name: Gwendolyn Brown MRN: [id number] DOB: 03/11/1930 Today's Date: 05/17/2016   History of Present Illness  82 yo female admitted with dyspnea. Hx of HTN, A fib, SSS, pacemaker, dementia.   Clinical Impression  On eval, pt required Mod assist for mobility. She was only able to ambulate ~5 feet with a RW. Mobility limited by bil knee pain, dyspnea, general weakness. Family present during evaluation. Both pt and family declined SNF placement during session.  Will follow and progress activity as tolerated.   Follow Up Recommendations Home health PT;Supervision/Assistance - 24 hour (pt and family declined SNF placement during eval); Home Health Aide    Equipment Recommendations  Wheelchair;Wheelchair cushion   Recommendations for Other Services OT consult     Precautions / Restrictions Precautions Precautions: Fall Restrictions Weight Bearing Restrictions: No      Mobility  Bed Mobility Overal bed mobility: Needs Assistance Bed Mobility: Supine to Sit     Supine to sit: [facility];Min assist     General bed mobility comments: Assist for LEs. Increased time. Mod cues.   Transfers Overall transfer level: Needs assistance Equipment used: Rolling walker (2 wheeled) Transfers: Sit to/from Stand Sit to Stand: From elevated surface;Mod assist         General transfer comment: Assist to rise, stabilize, control descent. Allowed pt to pull up on walker.   Ambulation/Gait Ambulation/Gait assistance: Min assist;+2 safety/equipment Ambulation Distance (Feet): 5 Feet Assistive device: Rolling walker (2 wheeled) Gait Pattern/deviations: Trunk flexed;Decreased step length - right;Decreased step length - left;Antalgic     General Gait Details: Increased time to walk a very short distance. Pt, at times, bend over and rested forearms on handles of walker. Cues for posture. Followed closely with recliner. Pt declined to attempt to walk any more  on today.  Stairs            Wheelchair Mobility    Modified Rankin (Stroke Patients Only)       Balance Overall balance assessment: Needs assistance         Standing balance support: Bilateral upper extremity supported Standing balance-Leahy Scale: Poor                               Pertinent Vitals/Pain Pain Assessment: Faces Faces Pain Scale: Hurts even more Pain Location: bil knees. warm to touch Pain Descriptors / Indicators: Aching;Sore Pain Intervention(s): Limited activity within patient's tolerance;Repositioned    Home Living Family/patient expects to be discharged to:: Unsure Living Arrangements: Children;Other relatives Available Help at Discharge: Family           Home Equipment: Dan HumphreysWalker - 2 wheels;Walker - 4 wheels      Prior Function Level of Independence: Needs assistance   Gait / Transfers Assistance Needed: able to ambulate household distance with a RW prior to 05/12/16           Hand Dominance        Extremity/Trunk Assessment   Upper Extremity Assessment: Generalized weakness           Lower Extremity Assessment: Generalized weakness      Cervical / Trunk Assessment: Normal  Communication   Communication: HOH  Cognition Arousal/Alertness: Awake/alert Behavior During Therapy: WFL for tasks assessed/performed Overall Cognitive Status: History of cognitive impairments - at baseline                      General Comments  Exercises General Exercises - Lower Extremity Long Arc Quad: AROM;Both;10 reps;Seated   Assessment/Plan    PT Assessment Patient needs continued PT services  PT Problem List Decreased strength;Decreased mobility;Decreased activity tolerance;Decreased balance;Pain;Decreased knowledge of use of DME;Decreased cognition;Decreased range of motion          PT Treatment Interventions DME instruction;Gait training;Functional mobility training;Therapeutic activities;Therapeutic  exercise;Balance training;Patient/family education    PT Goals (Current goals can be found in the Care Plan section)  Acute Rehab PT Goals Patient Stated Goal: to get stronger. to be able to transfer without assistance (per family) PT Goal Formulation: With patient/family Time For Goal Achievement: 05/30/16 Potential to Achieve Goals: Fair    Frequency Min 3X/week   Barriers to discharge        Co-evaluation               End of Session Equipment Utilized During Treatment: Gait belt Activity Tolerance: Patient limited by fatigue;Patient limited by pain Patient left: in chair;with call bell/phone within reach;with family/visitor present      Functional Assessment Tool Used: clinical judgement Functional Limitation: Mobility: Walking and moving around Mobility: Walking and Moving Around Current Status [phone]): At least 20 percent but less than 40 percent impaired, limited or restricted Mobility: Walking and Moving Around Goal Status [phone]): At least 1 percent but less than 20 percent impaired, limited or restricted    Time: 1009-1027 PT Time Calculation (min) (ACUTE ONLY): 18 min   Charges:   PT Evaluation $PT Eval Low Complexity: 1 Procedure     PT G Codes:   PT G-Codes **NOT FOR INPATIENT CLASS** Functional Assessment Tool Used: clinical judgement Functional Limitation: Mobility: Walking and moving around Mobility: Walking and Moving Around Current Status ([id number]): At least 20 percent but less than 40 percent impaired, limited or restricted Mobility: Walking and Moving Around Goal Status [phone]): At least 1 percent but less than 20 percent impaired, limited or restricted    Rebeca Alert, MPT Pager: [phone]

## 2016-05-17 NOTE — Progress Notes (Signed)
Speech Language Pathology Treatment: Dysphagia  Patient Details Name: Gwendolyn Brown MRN: [id number] DOB: 07/18/1930 Today's Date: 05/17/2016 Time: [id number] SLP Time Calculation (min) (ACUTE ONLY): 25 min  Assessment / Plan / Recommendation Clinical Impression  SLP was paged by OT who stated granddaughter requested to be educated to swallow precautions and MBS findings. SLP reviewed video flouro images with pt/granddaughter and educated pt/granddaughter to dysphagia mitigation strategies.  Suzette BattiestVeronica *granddaughter* expressed concern regarding pt having to learn a new way of eating, advised that at least while pt currently ill to encourage compensation/mitigation strategies.  Pt demonstrated appropriate use of chin tuck posture with total assist initially fading to max assist.  Anticipate she will be able to generalize strategy with repetition.    Using teach back and written precautions, educated pt/Veronica to findings. Also encouraged pt to maintain strength of cough and "hock" to clear vallecular residuals.  SLP will follow up briefly to assure tolerance and family education completed.  Recommend HH follow up for brief dysphagia management.  Thanks.    HPI HPI: 82 [facility] - fall in bathroom and dyspnea, CXR showed possible right base opacity that may indicate pna vs low lung volume from shallow inspiration.  PMH + for dementia, HTN, cardiac disease s/p pacemaker.   Pt does admit to dysphagia to food/drink occuring approximately once a week causing her to choke.  She also states she has required the heimlich manuever twice and denies prior pneumonias.  MBS ordered by Md.        SLP Plan  Continue with current plan of care     Recommendations  Diet recommendations: Regular;Thin liquid Liquids provided via: Cup;Straw Medication Administration: Whole meds with puree Supervision: Patient able to self feed;Full supervision/cueing for compensatory strategies Compensations:  Minimize environmental distractions;Slow rate;Small sips/bites;Chin tuck;Other (Comment) (intermittent dry swallow) Postural Changes and/or Swallow Maneuvers: Seated upright 90 degrees;Upright 30-60 min after meal                Oral Care Recommendations: Oral care BID Follow up Recommendations: Home health SLP Plan: Continue with current plan of care       GO           Functional Assessment Tool Used: mbs, clinical judgement Functional Limitations: Swallowing Swallow Current Status ([id number]([id number]): At least 40 percent but less than 60 percent impaired, limited or restricted Swallow Goal Status [phone]): At least 20 percent but less than 40 percent impaired, limited or restricted    Mills KollerKimball, Nayelis Bonito Ann Charlet Harr, MS [facility] SLP [phone]

## 2016-05-17 NOTE — Procedures (Addendum)
Objective Swallowing Evaluation: Type of Study: Modified Swallow Evaluation  Patient Details  Name: Gwendolyn Brown MRN: [id number] Date of Birth: June 29, 1930  Today's Date: 05/17/2016 Time: SLP Start Time (ACUTE ONLY): 1150-SLP Stop Time (ACUTE ONLY): 1225 SLP Time Calculation (min) (ACUTE ONLY): 35 min  Past Medical History:  Past Medical History:  Diagnosis Date  . Atrial fibrillation (HCC)   . Dementia   . Depression, major, in partial remission (HCC)   . Edema   . Hypertension   . Pacemaker   . Weakness generalized    Past Surgical History:  Past Surgical History:  Procedure Laterality Date  . GALLBLADDER SURGERY    . PACEMAKER INSERTION    . TONSILLECTOMY     HPI: 82 yo female adm to [facility] wtih weakness - fall in bathroom and dyspnea, CXR showed possible right base opacity that may indicate pna vs low lung volume from shallow inspiration.  PMH + for dementia, HTN, cardiac disease s/p pacemaker.   Pt does admit to dysphagia to food/drink occuring approximately once a week causing her to choke.  She also states she has required the heimlich manuever twice and denies prior pneumonias.  MBS ordered by Md.    Subjective: pt awake in chair   Assessment / Plan / Recommendation  CHL IP CLINICAL IMPRESSIONS 05/17/2016  Therapy Diagnosis Moderate oral phase dysphagia;Mild pharyngeal phase dysphagia  Clinical Impression Pt presents with moderate oral and mild pharyngeal dysphagia with sensorimotor deficits.  Pt with premature spillge of barium into pharynx poorly controlled and oral residuals due to weakness.   Anatomically epiglottis was curved anteriorally which at times obliterated vallecular space and allowed barium to spill over epiglottis into larynx.  Pt with consistent laryngeal penetration of thin/nectar liquids and aspiration of first bolus of nectar.  Aspiration followed by very subtle cough response.  Chin tuck posture helped to mitigate pt's dysphagia and prevent aspiration.  Using  live video and teach back, educated pt to findings/recommendations.    SLP to follow up for dysphagia management.  Recommend short term follow up at home from SLP.    Impact on safety and function Moderate aspiration risk      CHL IP TREATMENT RECOMMENDATION 05/17/2016  Treatment Recommendations Therapy as outlined in treatment plan below     Prognosis 05/17/2016  Prognosis for Safe Diet Advancement Fair  Barriers to Reach Goals Cognitive deficits  Barriers/Prognosis Comment --    CHL IP DIET RECOMMENDATION 05/17/2016  SLP Diet Recommendations --  Liquid Administration via Cup;Straw  Medication Administration Whole meds with puree  Compensations Slow rate;Small sips/bites;Chin tuck  Postural Changes --      CHL IP OTHER RECOMMENDATIONS 05/17/2016  Recommended Consults --  Oral Care Recommendations Oral care BID  Other Recommendations --      No flowsheet data found.    CHL IP FREQUENCY AND DURATION 05/17/2016  Speech Therapy Frequency (ACUTE ONLY) min 2x/week  Treatment Duration 2 weeks           CHL IP ORAL PHASE 05/17/2016  Oral Phase Impaired  Oral - Pudding Teaspoon --  Oral - Pudding Cup --  Oral - Honey Teaspoon --  Oral - Honey Cup --  Oral - Nectar Teaspoon Weak lingual manipulation;Reduced posterior propulsion;Delayed oral transit;Decreased bolus cohesion;Premature spillage  Oral - Nectar Cup Delayed oral transit;Decreased bolus cohesion;Piecemeal swallowing;Reduced posterior propulsion;Weak lingual manipulation;Premature spillage  Oral - Nectar Straw --  Oral - Thin Teaspoon Weak lingual manipulation;Reduced posterior propulsion;Delayed oral transit;Decreased bolus cohesion;Premature spillage  Oral - Thin Cup Weak lingual manipulation  Oral - Thin Straw Weak lingual manipulation  Oral - Puree Weak lingual manipulation;Reduced posterior propulsion;Delayed oral transit;Premature spillage;Piecemeal swallowing;Lingual pumping;Decreased bolus cohesion  Oral - Mech Soft  --  Oral - Regular Weak lingual manipulation;Lingual pumping;Reduced posterior propulsion;Impaired mastication;Delayed oral transit;Decreased bolus cohesion;Piecemeal swallowing;Lingual/palatal residue;Premature spillage  Oral - Multi-Consistency --  Oral - Pill Weak lingual manipulation;Premature spillage;Decreased bolus cohesion;Impaired mastication;Reduced posterior propulsion;Piecemeal swallowing;Delayed oral transit  Oral Phase - Comment pt with premature spillage of boluses into pharynx uncontrolled, piecemealing    CHL IP PHARYNGEAL PHASE 05/17/2016  Pharyngeal Phase Impaired  Pharyngeal- Pudding Teaspoon --  Pharyngeal --  Pharyngeal- Pudding Cup --  Pharyngeal --  Pharyngeal- Honey Teaspoon --  Pharyngeal --  Pharyngeal- Honey Cup --  Pharyngeal --  Pharyngeal- Nectar Teaspoon Delayed swallow initiation-vallecula;Reduced epiglottic inversion;Reduced laryngeal elevation;Reduced airway/laryngeal closure;Penetration/Aspiration during swallow  Pharyngeal Material enters airway, remains ABOVE vocal cords and not ejected out  Pharyngeal- Nectar Cup Delayed swallow initiation-vallecula;Reduced laryngeal elevation;Reduced airway/laryngeal closure;Reduced tongue base retraction;Reduced epiglottic inversion  Pharyngeal --  Pharyngeal- Nectar Straw Reduced anterior laryngeal mobility;Reduced laryngeal elevation;Reduced airway/laryngeal closure;Penetration/Aspiration during swallow  Pharyngeal Material enters airway, remains ABOVE vocal cords and not ejected out  Pharyngeal- Thin Teaspoon Delayed swallow initiation-vallecula;Reduced epiglottic inversion;Reduced laryngeal elevation;Reduced airway/laryngeal closure;Reduced tongue base retraction;Penetration/Aspiration during swallow  Pharyngeal Material enters airway, CONTACTS cords and not ejected out  Pharyngeal- Thin Cup Delayed swallow initiation-vallecula;Reduced laryngeal elevation;Reduced airway/laryngeal closure;Reduced tongue base  retraction;Penetration/Aspiration during swallow;Reduced epiglottic inversion  Pharyngeal Material enters airway, CONTACTS cords and not ejected out  Pharyngeal- Thin Straw Delayed swallow initiation-vallecula;Reduced epiglottic inversion  Pharyngeal --  Pharyngeal- Puree Delayed swallow initiation-vallecula;Reduced epiglottic inversion;Reduced tongue base retraction;Pharyngeal residue - valleculae  Pharyngeal --  Pharyngeal- Mechanical Soft --  Pharyngeal --  Pharyngeal- Regular Delayed swallow initiation-vallecula;Pharyngeal residue - valleculae;Reduced epiglottic inversion;Reduced tongue base retraction;Reduced airway/laryngeal closure  Pharyngeal --  Pharyngeal- Multi-consistency --  Pharyngeal --  Pharyngeal- Pill Delayed swallow initiation-vallecula;Reduced airway/laryngeal closure;Reduced laryngeal elevation;Reduced tongue base retraction;Penetration/Aspiration during swallow  Pharyngeal Material enters airway, remains ABOVE vocal cords and not ejected out  Pharyngeal Comment --     CHL IP CERVICAL ESOPHAGEAL PHASE 05/17/2016  Cervical Esophageal Phase WFL  Pudding Teaspoon --  Pudding Cup --  Honey Teaspoon --  Honey Cup --  Nectar Teaspoon --  Nectar Cup --  Nectar Straw --  Thin Teaspoon --  Thin Cup --  Thin Straw --  Puree --  Mechanical Soft --  Regular --  Multi-consistency --  Pill --  Cervical Esophageal Comment minimal residuals at proximal esophagus     CHL IP GO 05/17/2016  Functional Assessment Tool Used mbs, clinical judgement  Functional Limitations Swallowing  Swallow Current Status (Z6109) CK  Swallow Goal Status (U0454) CJ  Swallow Discharge Status (U9811) (None)  Motor Speech Current Status (B1478) (None)  Motor Speech Goal Status (G9562) (None)  Motor Speech Goal Status (Z3086) (None)  Spoken Language Comprehension Current Status (V7846) (None)  Spoken Language Comprehension Goal Status (N6295) (None)  Spoken Language Comprehension Discharge  Status (M8413) (None)  Spoken Language Expression Current Status (K4401) (None)  Spoken Language Expression Goal Status (U2725) (None)  Spoken Language Expression Discharge Status [phone] (None)  Attention Current Status (I3474) (None)  Attention Goal Status (Q5956) (None)  Attention Discharge Status (L8756) (None)  Memory Current Status (E3329) (None)  Memory Goal Status (J1884) (None)  Memory Discharge Status (Z6606) (None)  Voice Current Status (T0160) (None)  Voice Goal Status (F0932) (None)  Voice Discharge Status [phone](G9173) (None)  Other Speech-Language Pathology Functional Limitation [phone](G9174) (None)  Other Speech-Language Pathology Functional Limitation Goal Status (W2956(G9175) (None)  Other Speech-Language Pathology Functional Limitation Discharge Status [phone](G9176) (None)    Mills KollerKimball, Vonzella Althaus Ann Mendi Con[place], MS [facility] SLP [phone]

## 2016-05-17 NOTE — Progress Notes (Signed)
Date: May 17, 2016 [facility] for home heart failure screening. Marcelle Smilinghonda Davis, RN, BSN, [place]   [phone]

## 2016-05-17 NOTE — Evaluation (Signed)
Occupational Therapy Evaluation Patient Details Name: Gwendolyn Brown MRN: [id number] DOB: 05/17/1930 Today's Date: 05/17/2016    History of Present Illness 82 yo female admitted with dyspnea. Hx of HTN, A fib, SSS, pacemaker, dementia.    Clinical Impression   Pt admitted with dyspnea. Pt currently with functional limitations due to the deficits listed below (see OT Problem List).  Pt will benefit from skilled OT to increase their safety and independence with ADL and functional mobility for ADL to facilitate discharge to venue listed below.     Follow Up Recommendations  Home health OT;SNF;Other (comment) (depending on family support)    Equipment Recommendations  Other (comment) (TBD)       Precautions / Restrictions Precautions Precautions: Fall Restrictions Weight Bearing Restrictions: No      Mobility Bed Mobility Overal bed mobility: Needs Assistance Bed Mobility: Supine to Sit     Supine to sit: HOB elevated;Min assist     General bed mobility comments: Assist for LEs. Increased time. Mod cues.   Transfers Overall transfer level: Needs assistance Equipment used: Rolling walker (2 wheeled) Transfers: Sit to/from UGI CorporationStand;Stand Pivot Transfers Sit to Stand: From elevated surface;Mod assist         General transfer comment: Assist to rise, stabilize, control descent. Allowed pt to pull up on walker.     Balance Overall balance assessment: Needs assistance         Standing balance support: Bilateral upper extremity supported Standing balance-Leahy Scale: Poor                              ADL Overall ADL's : Needs assistance/impaired Eating/Feeding: Set up;Sitting   Grooming: Set up;Sitting       Lower Body Bathing: +2 for physical assistance;Maximal assistance;Cueing for sequencing;Cueing for compensatory techniques;Cueing for safety;+2 for safety/equipment   Upper Body Dressing : Minimal assistance;Sitting   Lower Body Dressing: +2 for  safety/equipment;Maximal assistance;Cueing for safety;+2 for physical assistance;Cueing for sequencing   Toilet Transfer: Moderate assistance;+2 for physical assistance;+2 for safety/equipment;Stand-pivot;RW;Ambulation;Cueing for sequencing Toilet Transfer Details (indicate cue type and reason): bed to chair                           Pertinent Vitals/Pain Pain Assessment: Faces Faces Pain Scale: Hurts little more Pain Location: both knees Pain Descriptors / Indicators: Aching;Sore Pain Intervention(s): Monitored during session     Hand Dominance     Extremity/Trunk Assessment Upper Extremity Assessment Upper Extremity Assessment: Generalized weakness   Lower Extremity Assessment Lower Extremity Assessment: Generalized weakness   Cervical / Trunk Assessment Cervical / Trunk Assessment: Normal   Communication Communication Communication: HOH   Cognition Arousal/Alertness: Awake/alert Behavior During Therapy: WFL for tasks assessed/performed Overall Cognitive Status: History of cognitive impairments - at baseline                           Shoulder Instructions      Home Living Family/patient expects to be discharged to:: Unsure Living Arrangements: Children;Other relatives Available Help at Discharge: Family                         Home Equipment: Dan HumphreysWalker - 2 wheels;Walker - 4 wheels          Prior Functioning/Environment Level of Independence: Needs assistance  Gait / Transfers Assistance Needed: able to ambulate household  distance with a RW prior to 05/12/16 ADL's / Homemaking Assistance Needed: needs A with bath and meal planning            OT Problem List: Decreased strength;Pain;Decreased knowledge of use of DME or AE   OT Treatment/Interventions: Self-care/ADL training;Patient/family education;Therapeutic activities    OT Goals(Current goals can be found in the care plan section) Acute Rehab OT Goals Patient Stated Goal: to  get stronger. to be able to transfer without assistance (per family) OT Goal Formulation: With patient Time For Goal Achievement: 05/31/16 Potential to Achieve Goals: Good  OT Frequency: Min 2X/week   Barriers to D/C:               End of Session Nurse Communication: Mobility status  Activity Tolerance: Patient tolerated treatment well;No increased pain Patient left: in chair;with call bell/phone within reach;with family/visitor present   Time: 4098-1191 OT Time Calculation (min): 44 min Charges:  OT General Charges $OT Visit: 1 Procedure OT Evaluation $OT Eval Moderate Complexity: 1 Procedure OT Treatments $Self Care/Home Management : 23-37 mins G-Codes: OT G-codes **NOT FOR INPATIENT CLASS** Functional Assessment Tool Used: clinical observation Functional Limitation: Self care Self Care Current Status (Y7829): At least 60 percent but less than 80 percent impaired, limited or restricted Self Care Goal Status (F6213): At least 1 percent but less than 20 percent impaired, limited or restricted  Yordy Matton, Metro Kung 05/17/2016, 2:12 PM

## 2016-05-17 NOTE — Progress Notes (Signed)
SLP received order for MBS, will plan today as ordered.  Thanks. Donavan Burnetamara Onie Hayashi, MS [facility] SLP [phone]

## 2016-05-18 ENCOUNTER — Inpatient Hospital Stay (HOSPITAL_COMMUNITY): Payer: Medicare Other

## 2016-05-18 DIAGNOSIS — I509 Heart failure, unspecified: Secondary | ICD-10-CM

## 2016-05-18 DIAGNOSIS — N390 Urinary tract infection, site not specified: Secondary | ICD-10-CM

## 2016-05-18 LAB — CBC
HEMATOCRIT: 40.6 % (ref 36.0–46.0)
Hemoglobin: 13.9 g/dL (ref 12.0–15.0)
MCH: 28.9 pg (ref 26.0–34.0)
MCHC: 34.2 g/dL (ref 30.0–36.0)
MCV: 84.4 fL (ref 78.0–100.0)
PLATELETS: 288 10*3/uL (ref 150–400)
RBC: 4.81 MIL/uL (ref 3.87–5.11)
RDW: 16.5 % — AB (ref 11.5–15.5)
WBC: 10.4 10*3/uL (ref 4.0–10.5)

## 2016-05-18 LAB — BASIC METABOLIC PANEL
ANION GAP: 10 (ref 5–15)
BUN: 10 mg/dL (ref 6–20)
CALCIUM: 9.3 mg/dL (ref 8.9–10.3)
CO2: 32 mmol/L (ref 22–32)
Chloride: 93 mmol/L — ABNORMAL LOW (ref 101–111)
Creatinine, Ser: 0.76 mg/dL (ref 0.44–1.00)
GFR calc Af Amer: >60 mL/min (ref >60)
Glucose, Bld: 148 mg/dL — ABNORMAL HIGH (ref 65–99)
POTASSIUM: 3.6 mmol/L (ref 3.5–5.1)
SODIUM: 135 mmol/L (ref 135–145)

## 2016-05-18 LAB — ECHOCARDIOGRAM COMPLETE
Height: 61 in
WEIGHTICAEL: 2880 [oz_av]

## 2016-05-18 MED ORDER — DEXTROSE 5 % IV SOLN
1.0000 g | INTRAVENOUS | Status: DC
  Administered 2016-05-18 – 2016-05-19 (×2): 1 g via INTRAVENOUS
  Filled 2016-05-18 (×2): qty 10

## 2016-05-18 NOTE — Progress Notes (Signed)
*  PRELIMINARY RESULTS* Echocardiogram 2D Echocardiogram has been performed.  Gwendolyn Brown 05/18/2016, 10:44 AM 

## 2016-05-18 NOTE — Progress Notes (Signed)
Pharmacy Antibiotic Note  Gwendolyn Brown is a 82 y.o. female admitted on 05/16/2016 with UTI.  Pharmacy has been consulted for ceftriaxone dosing.   Dosage will likely remain stable at 1 gr IV q24h and need for further dosage adjustment appears unlikely at present.    Will sign off at this time.  Please reconsult if a change in clinical status warrants re-evaluation of dosage.   Plan: Ceftriaxone 1 gr IV q24h  Height: 5\' 1"  (154.9 cm) Weight: 180 lb (81.6 kg) IBW/kg (Calculated) : 47.8  Temp (24hrs), Avg:98.2 F (36.8 C), Min:97.7 F (36.5 C), Max:98.8 F (37.1 C)   Recent Labs Lab 05/16/16 0839 05/17/16 0519 05/18/16 0536  WBC 9.4  --  10.4  CREATININE 0.85 0.72 0.76    Estimated Creatinine Clearance: 50.7 mL/min (by C-G formula based on SCr of 0.76 mg/dL).    Allergies  Allergen Reactions  . Codeine Rash    Antimicrobials this admission: 9/26 ceftriaxone >>    Dose adjustments this admission: ---  Microbiology results: 9/24 UCx:  >100K colonies of E.coli    Thank you for allowing pharmacy to be a part of this patient's care.   Adalberto ColeNikola Jaella Weinert, PharmD, BCPS Pager [phone] 05/18/2016 11:11 AM

## 2016-05-18 NOTE — Progress Notes (Signed)
Physical Therapy Treatment Patient Details Name: Gwendolyn Brown MRN: [id number] DOB: 06/15/1930 Today's Date: 05/18/2016    History of Present Illness 82 yo female admitted with dyspnea. Hx of HTN, A fib, SSS, pacemaker, dementia.     PT Comments    Pt progressing poorly.  Still requires + 2 assist for mobility and demonstrates poor cognition which impairers all self functions.    Follow Up Recommendations  Home health PT;Supervision/Assistance - 3924 hour;SNF  Daughter unsure of D/C plans forward but discussed with her poss need for SNF   Equipment Recommendations       Recommendations for Other Services       Precautions / Restrictions Precautions Precautions: Fall Precaution Comments: B knee arthritis Restrictions Weight Bearing Restrictions: No Other Position/Activity Restrictions: WBAT    Mobility  Bed Mobility Overal bed mobility: Needs Assistance Bed Mobility: Supine to Sit     Supine to sit: HOB elevated;Max assist     General bed mobility comments: Repeat functional VC's to complete task and stay focused.    Transfers Overall transfer level: Needs assistance Equipment used: Rolling walker (2 wheeled) Transfers: Sit to/from [employer] CorporationStand;Stand Pivot Transfers Sit to Stand: From elevated surface;+2 physical assistance;Max assist         General transfer comment: + 2 assist partial stance and incomplete pivot from elevated bed to [facility].  Pt demonstrated impaired cognition to task.    Ambulation/Gait Ambulation/Gait assistance: Max assist;+2 physical assistance;+2 safety/equipment Ambulation Distance (Feet): 2 Feet Assistive device: Rolling walker (2 wheeled) Gait Pattern/deviations: Step-to pattern     General Gait Details: + 2 assist with inability to self advance either LE and poor standing posture.  #rd assist following with recliner.     Stairs            Wheelchair Mobility    Modified Rankin (Stroke Patients Only)       Balance                                     Cognition Arousal/Alertness: Awake/alert Behavior During Therapy: Flat affect Overall Cognitive Status: Impaired/Different from baseline (expressive aphagia) Area of Impairment: Orientation;Attention;Memory;Following commands Orientation Level: Person     Following Commands: Follows one step commands inconsistently       General Comments: noted cognitive impairment inhibited session.      Exercises      General Comments        Pertinent Vitals/Pain Pain Assessment: Faces Faces Pain Scale: Hurts little more Pain Location: both knees Pain Descriptors / Indicators: Constant;Aching Pain Intervention(s): Limited activity within patient's tolerance;Repositioned    Home Living                      Prior Function            PT Goals (current goals can now be found in the care plan section) Progress towards PT goals: Progressing toward goals    Frequency    Min 3X/week      PT Plan Current plan remains appropriate    Co-evaluation             End of Session   Activity Tolerance: Other (comment) (limited cognition/awareness) Patient left: in chair;with call bell/phone within reach;with chair alarm set     Time: 4098-11911104-1129 PT Time Calculation (min) (ACUTE ONLY): 25 min  Charges:  $Gait Training: 8-22 mins $Therapeutic Activity: 8-22 mins  G Codes:      Rica Koyanagi  PTA [facility]  Acute  Rehab Pager      [phone]

## 2016-05-18 NOTE — NC FL2 (Signed)
[place] MEDICAID FL2 LEVEL OF CARE SCREENING TOOL     IDENTIFICATION  Patient Name: Gwendolyn Brown Birthdate: 12/12/1929 Sex: female Admission Date (Current Location): 05/16/2016  [facility] [place]   [facility] Address[facility]  [facility],  [address], [place] [id number]      Provider Number: [id number]  Attending Physician Name and Address:  Edsel PetrinMaryann Mikhail, DO  Relative Name and Phone Number:       Current Level of Care: SNF Recommended Level of Care: Skilled Nursing Facility Prior Approval Number:    Date Approved/Denied:   PASRR Number:    Discharge Plan: SNF    Current Diagnoses: Patient Active Problem List   Diagnosis Date Noted  . UTI (lower urinary tract infection) 05/17/2016  . Dyspnea 05/16/2016  . Atrial fibrillation (HCC) 05/16/2016  . Dementia 05/16/2016  . Pacemaker 05/16/2016  . Hypertension 05/16/2016  . Sick sinus syndrome (HCC) 05/16/2016  . Hypertensive urgency 05/16/2016  . Asymptomatic bacteriuria 05/16/2016    Orientation RESPIRATION BLADDER Height & Weight     Self  Normal Incontinent Weight: 81.6 kg (180 lb) Height:  5\' 1"  (154.9 cm)  BEHAVIORAL SYMPTOMS/MOOD NEUROLOGICAL BOWEL NUTRITION STATUS      Incontinent Diet (Heart Healthy )  AMBULATORY STATUS COMMUNICATION OF NEEDS Skin   Extensive Assist Verbally Normal                       Personal Care Assistance Level of Assistance  Bathing, Feeding, Dressing Bathing Assistance: Limited assistance Feeding assistance: Limited assistance Dressing Assistance: Limited assistance     Functional Limitations Info  Sight, Hearing, Speech Sight Info: Adequate Hearing Info: Adequate Speech Info: Adequate    SPECIAL CARE FACTORS FREQUENCY  PT (By licensed PT), OT (By licensed OT)     PT Frequency: 5 OT Frequency: 5            Contractures Contractures Info: Not present    Additional Factors Info  Code Status, Allergies, Psychotropic Code  Status Info: DNR Allergies Info: Codeine Psychotropic Info: Yes         Current Medications (05/18/2016):  This is the current hospital active medication list Current Facility-Administered Medications  Medication Dose Route Frequency Provider Last Rate Last Dose  . 0.9 %  sodium chloride infusion  250 mL Intravenous PRN Narda Bondsalph A Nettey, MD      . acetaminophen (TYLENOL) tablet 650 mg  650 mg Oral Q4H PRN Narda Bondsalph A Nettey, MD   650 mg at 05/18/16 1132  . apixaban (ELIQUIS) tablet 5 mg  5 mg Oral BID Narda Bondsalph A Nettey, MD   5 mg at 05/18/16 1240  . cefTRIAXone (ROCEPHIN) 1 g in dextrose 5 % 50 mL IVPB  1 g Intravenous Q24H Nikola Glogovac, RPH   1 g at 05/18/16 1240  . docusate sodium (COLACE) capsule 100 mg  100 mg Oral Daily PRN Narda Bondsalph A Nettey, MD      . furosemide (LASIX) injection 40 mg  40 mg Intravenous Daily Narda Bondsalph A Nettey, MD   40 mg at 05/18/16 1132  . hydrALAZINE (APRESOLINE) injection 10 mg  10 mg Intravenous Q6H PRN Maryann Mikhail, DO   10 mg at 05/17/16 1504  . lisinopril (PRINIVIL,ZESTRIL) tablet 5 mg  5 mg Oral Daily Narda Bondsalph A Nettey, MD   5 mg at 05/18/16 1131  . metoprolol succinate (TOPROL-XL) 24 hr tablet 200 mg  200 mg Oral BID Narda Bondsalph A Nettey, MD   200  mg at 05/18/16 1131  . ondansetron (ZOFRAN) injection 4 mg  4 mg Intravenous Q6H PRN Narda Bonds, MD      . oxyCODONE (Oxy IR/ROXICODONE) immediate release tablet 5 mg  5 mg Oral Q4H PRN Narda Bonds, MD      . sodium chloride flush (NS) 0.9 % injection 3 mL  3 mL Intravenous Q12H Narda Bonds, MD   3 mL at 05/17/16 2200  . sodium chloride flush (NS) 0.9 % injection 3 mL  3 mL Intravenous PRN Narda Bonds, MD      . traZODone (DESYREL) tablet 50 mg  50 mg Oral QHS Narda Bonds, MD   50 mg at 05/17/16 2159  . triamterene-hydrochlorothiazide (MAXZIDE-25) 37.5-25 MG per tablet 1 tablet  1 tablet Oral Daily Narda Bonds, MD   1 tablet at 05/18/16 1131  . venlafaxine XR (EFFEXOR-XR) 24 hr capsule 37.5 mg  37.5 mg Oral Q  breakfast Narda Bonds, MD   37.5 mg at 05/18/16 5366     Discharge Medications: Please see discharge summary for a list of discharge medications.  Relevant Imaging Results:  Relevant Lab Results:   Additional Information SS#[id number]  Clearance Coots, LCSW

## 2016-05-18 NOTE — Progress Notes (Signed)
SLP Cancellation Note  Patient Details Name: Gwendolyn Brown MRN: [id number] DOB: 12/20/1929   Cancelled treatment:       Reason Eval/Treat Not Completed: Other (comment) (pt working with PT, will continue efforts)   Mills KollerKimball, Raygen Linquist Ann Roneka Gilpin, MS [facility] SLP [phone]

## 2016-05-18 NOTE — Progress Notes (Signed)
Occupational Therapy Treatment Patient Details Name: [place] Charonllen Morgan MRN: [id number] DOB: 09/15/1929 Today's Date: 05/18/2016    History of present illness 82 yo female admitted with dyspnea. Hx of HTN, A fib, SSS, pacemaker, dementia.    OT comments  Pt very fatigued this morning.  Follow Up Recommendations  SNF    Equipment Recommendations  None recommended by OT       Precautions / Restrictions Precautions Precautions: Fall       Mobility Bed Mobility      n/a            Transfers                 General transfer comment: pt declined OOB at this time due to fatigue        ADL Overall ADL's : Needs assistance/impaired                                       General ADL Comments: Lengthy discussion regarding need for rehab with daughter. Daughter reports she has a bad back and a bad shoulder. Explained pt is currently needing 2 person A and used toileting as an example of activty that would likely need 2 person A.  OT provided pts daughter with much informaiton regarding need for rehab.  OT explained need for OT and PT. Pts daughter kept referring to her home therapy and OT explained pt was not at a lower level and would need more A. Pts daughter seemed to have a hard time grasping this information.                Cognition   Behavior During Therapy: WFL for tasks assessed/performed Overall Cognitive Status: History of cognitive impairments - at baseline                                    Pertinent Vitals/ Pain       Pain Assessment: No/denies pain         Frequency  Min 2X/week        Progress Toward Goals  OT Goals(current goals can now be found in the care plan section)  Progress towards OT goals: OT to reassess next treatment     Plan Discharge plan needs to be updated       End of Session     Activity Tolerance Patient limited by lethargy   Patient Left in bed;with call bell/phone within reach;with  family/visitor present   Nurse Communication Mobility status        Time: [id number] OT Time Calculation (min): 24 min  Charges: OT General Charges $OT Visit: 1 Procedure OT Treatments $Self Care/Home Management : 23-37 mins  Ralphine Hinks, [place] KungLorraine D 05/18/2016, 11:39 AM

## 2016-05-18 NOTE — Clinical Social Work Note (Signed)
Clinical Social Work Assessment  Patient Details  Name: Gwendolyn Brown MRN: [id number] Date of Birth: April 29, 1930  Date of referral:  05/18/16               Reason for consult:  Facility Placement                Permission sought to share information with:  Facility Sport and exercise psychologist, Family Supports Permission granted to share information::  Yes, Verbal Permission Granted  Name::        Agency::     Relationship::     Contact Information:     Newell,Tacy Daughter [phone]    Gaynelle Adu [phone]       Housing/Transportation Living arrangements for the past 2 months:  [place] of Information:  Adult Children Patient Interpreter Needed:  None Criminal Activity/Legal Involvement Pertinent to Current Situation/Hospitalization:  No - Comment as needed Significant Relationships:  Adult Children Lives with:  Self Do you feel safe going back to the place where you live?  Yes Need for family participation in patient care:  Yes (Comment)  Care giving concerns:  Patient daughter Elpidio Eric reports she has watched her mother's health decline over the past month. She reports the patient has weakness in the legs and has difficulty ambulating. She reports the patient has home health through [facility] but feels the patient needs physical therapy before going home. She reports going to a nursing home long term is one of the patient's greatest fears. The patient has agreed to do short term rehab.    Social Worker assessment / plan:  LCSWA met with daughter at bedside. The patient slept soundly during the assessment. She reports the family just moved to the area about ninety days ago. The family gladly accepts a SNF list to view the facilities before making a final decision.  Plan: Assist with disposition.   Employment status:  Retired Forensic scientist:  Medicare PT Recommendations:  [place] / Referral to community resources:   [facility]  Patient/Family's Response to care:  Agreeable and responding well to care.  Patient/Family's Understanding of and Emotional Response to Diagnosis, Current Treatment, and Prognosis:  " We have been taking care of her for the past five years, the doctor is recommending SNF and based on what she told me I can agree to it."  Emotional Assessment Appearance:  Appears stated age Attitude/Demeanor/Rapport:  Lethargic Affect (typically observed):    Orientation:  Oriented to Self Alcohol / Substance use:  Not Applicable Psych involvement (Current and /or in the community):  No (Comment)  Discharge Needs  Concerns to be addressed:  Care Coordination, Discharge Planning Concerns Readmission within the last 30 days:  No Current discharge risk:  Dependent with Mobility Barriers to Discharge:  Continued Medical Work up, [place], LCSW 05/18/2016, 4:15 PM

## 2016-05-18 NOTE — Progress Notes (Addendum)
PROGRESS NOTE    Gwendolyn Brown  NGE:[id number] DOB: 02/18/1930 DOA: 05/16/2016 PCP:  Duane Lope, MD   Chief Complaint  Patient presents with  . Shortness of Breath    Brief Narrative:  HPI on 05/16/2016 by Dr. Jacquelin Hawking Gwendolyn Brown is a 82 y.o. female with medical history significant of hypertension, atrial fibrillation on chronic anticoagulation, sick sinus syndrome status post pacer, dementia, depression. About 4 days ago, patient started having mild to moderate dyspnea. She has had associated nonproductive coughing and increasing weakness. She has not taken anything to help with her symptoms. Dyspnea continued to worsen, in addition to patient's increasing weakness, so EMS was called for transport to the [facility] department.  Assessment & Plan   Dyspnea/Weight Gain -CXR Confluent area of airspace opacity at the right lung base, pneumonia cannot be excluded. -BNP 341 -Echocardiogram pending -Continue IV lasix -Monitor daily weights, intake/output  Accelerated Hypertension/Essential hypertension,uncontrolled -Continue Lasix, lisinopril, metoprolol -Continue hydralazine PRN  Atrial fibrillation, chronic -CHADSVASC 4 (age, gender, HTN) -Continue Eliquis  Sick sinus syndrome -has pacemaker  -Sees Dr. Sharrell Ku  Depression -Continue Effexor  Dementia -Per family, may be progressing  -Patient does have short term memory loss  Asymptomatic bacteriuria -UA showed many bacteria, 6:30 WBC, moderate leukocytes, positive nitrites -Urine culture >100K Ecoli -Will start patient on ceftriaxone  Dysphagia -Patient has seen speech as an outpatient -Was supposed to have a modified barium swallow -Barium swallow obtained, recommendations: Regular diet, thin liquid, meds to be given in puree, sit up 30 minutes after eating  Deconditioning -PT and OT consulted- rec SNF vs family care -patient has had PT/OT as an outpatient -Spoke with patient's daughter at bedside.  Discussed  differences between SNF and long term nursing home placement.  They would be agreeable to SNF placement for REHAB.  Hypokalemia -Resovled, Likely secondary to diuresis -Continue to monitor BMP  DVT Prophylaxis  Eliquis  Code Status: DO NOT RESUSCITATE  Family Communication: Family bedside  Disposition Plan: [facility]. Pending echocardiogram. Continue treatment for suspected heart failure  Consultants None  Procedures  None  Antibiotics   Anti-infectives    None      Subjective:   [name] seen and examined today.  Patient feel shortness of breath has mildly improved, although she cannot remember how she felt yesterday. Denies chest pain, abdominal pain, N/V/D/C, headache, dizziness. Complains of knee pain.   Objective:   Vitals:   05/17/16 1453 05/17/16 1504 05/17/16 2000 05/18/16 0559  BP: (!) 199/90 (!) 199/90 (!) 187/74 (!) 188/89  Pulse: 71  80 63  Resp: 18  18 18   Temp: 97.7 F (36.5 C)  98.2 F (36.8 C) 98.8 F (37.1 C)  TempSrc: Oral  Oral Oral  SpO2: 95%  95% 95%  Weight:    81.6 kg (180 lb)  Height:        Intake/Output Summary (Last 24 hours) at 05/18/16 1051 Last data filed at 05/18/16 0600  Gross per 24 hour  Intake              120 ml  Output                0 ml  Net              120 ml   Filed Weights   05/16/16 0826 05/16/16 1214 05/18/16 0559  Weight: 81.6 kg (180 lb) 81.3 kg (179 lb 3.7 oz) 81.6 kg (180 lb)    Exam  General: Well developed,  well nourished, somnolent (but arousable)  HEENT: NCAT, mucous membranes moist.   Cardiovascular: S1 S2 auscultated, irregular  Respiratory: Diffuse expiratory wheezing, labored breathing  Abdomen: Soft, nontender, nondistended, + bowel sounds  Extremities: Lower extremity edema   Data Reviewed: I have personally reviewed following labs and imaging studies  CBC:  Recent Labs Lab 05/16/16 0839 05/18/16 0536  WBC 9.4 10.4  NEUTROABS 7.3  --   HGB 13.8 13.9  HCT 43.1 40.6  MCV  89.0 84.4  PLT 275 288   Basic Metabolic Panel:  Recent Labs Lab 05/16/16 0839 05/17/16 0519 05/18/16 0536  NA 139 138 135  K 3.8 3.2* 3.6  CL 101 97* 93*  CO2 27 31 32  GLUCOSE 134* 130* 148*  BUN 14 13 10   CREATININE 0.85 0.72 0.76  CALCIUM 9.3 8.9 9.3   GFR: Estimated Creatinine Clearance: 50.7 mL/min (by C-G formula based on SCr of 0.76 mg/dL). Liver Function Tests:  Recent Labs Lab 05/16/16 0839  AST 25  ALT 23  ALKPHOS 81  BILITOT 1.2  PROT 6.6  ALBUMIN 3.5   No results for input(s): LIPASE, AMYLASE in the last 168 hours. No results for input(s): AMMONIA in the last 168 hours. Coagulation Profile: No results for input(s): INR, PROTIME in the last 168 hours. Cardiac Enzymes: No results for input(s): CKTOTAL, CKMB, CKMBINDEX, TROPONINI in the last 168 hours. BNP (last 3 results) No results for input(s): PROBNP in the last 8760 hours. HbA1C: No results for input(s): HGBA1C in the last 72 hours. CBG: No results for input(s): GLUCAP in the last 168 hours. Lipid Profile: No results for input(s): CHOL, HDL, LDLCALC, TRIG, CHOLHDL, LDLDIRECT in the last 72 hours. Thyroid Function Tests:  Recent Labs  05/17/16 0519  TSH 0.899   Anemia Panel: No results for input(s): VITAMINB12, FOLATE, FERRITIN, TIBC, IRON, RETICCTPCT in the last 72 hours. Urine analysis:    Component Value Date/Time   COLORURINE YELLOW 05/16/2016 0810   APPEARANCEUR CLOUDY (A) 05/16/2016 0810   LABSPEC 1.016 05/16/2016 0810   PHURINE 5.5 05/16/2016 0810   GLUCOSEU NEGATIVE 05/16/2016 0810   HGBUR MODERATE (A) 05/16/2016 0810   BILIRUBINUR NEGATIVE 05/16/2016 0810   KETONESUR NEGATIVE 05/16/2016 0810   PROTEINUR NEGATIVE 05/16/2016 0810   NITRITE POSITIVE (A) 05/16/2016 0810   LEUKOCYTESUR MODERATE (A) 05/16/2016 0810   Sepsis Labs: @LABRCNTIP (procalcitonin:4,lacticidven:4)  ) Recent Results (from the past 240 hour(s))  Culture, Urine     Status: Abnormal (Preliminary result)     Collection Time: 05/16/16  8:10 AM  Result Value Ref Range Status   Specimen Description URINE, CLEAN CATCH  Final   Special Requests NONE  Final   Culture (A)  Final    >=100,000 COLONIES/mL ESCHERICHIA COLI SUSCEPTIBILITIES TO FOLLOW Performed at [facility]    Report Status PENDING  Incomplete      Radiology Studies: Dg Swallowing Func-speech Pathology  Result Date: 05/17/2016 Objective Swallowing Evaluation: Type of Study: Bedside Swallow Evaluation Patient Details Name: Joleigh Mineau MRN: [id number] Date of Birth: 08/13/1930 Today's Date: 05/17/2016 Time: SLP Start Time (ACUTE ONLY): 1150-SLP Stop Time (ACUTE ONLY): 1225 SLP Time Calculation (min) (ACUTE ONLY): 35 min Past Medical History: Past Medical History: Diagnosis Date . Atrial fibrillation (HCC)  . Dementia  . Depression, major, in partial remission (HCC)  . Edema  . Hypertension  . Pacemaker  . Weakness generalized  Past Surgical History: Past Surgical History: Procedure Laterality Date . GALLBLADDER SURGERY   . PACEMAKER INSERTION   .  TONSILLECTOMY   HPI: 82 yo female adm [facility] wtih weakness - fall in bathroom and dyspnea, CXR showed possible right base opacity that may indicate pna vs low lung volume from shallow inspiration.  PMH + for dementia, HTN, cardiac disease s/p pacemaker.   Pt does admit to dysphagia to food/drink occuring approximately once a week causing her to choke.  She also states she has required the heimlich manuever twice and denies prior pneumonias.  MBS ordered by Md.   Subjective: pt awake in chair Assessment / Plan / Recommendation CHL IP CLINICAL IMPRESSIONS 05/17/2016 Therapy Diagnosis Moderate oral phase dysphagia;Mild pharyngeal phase dysphagia Clinical Impression Pt presents with moderate oral and mild pharyngeal dysphagia with sensorimotor deficits.  Pt with premature spillge of barium into pharynx poorly controlled and oral residuals due to weakness.   Anatomically epiglottis was curved anteriorally  which at times obliterated vallecular space and allowed barium to spill over epiglottis into larynx.  Pt with consistent laryngeal penetration of thin/nectar liquids and aspiration of first bolus of nectar.  Aspiration followed by very subtle cough response.  Chin tuck posture helped to mitigate pt's dysphagia and prevent aspiration.  Using live video and teach back, educated pt to findings/recommendations.    SLP to follow up for dysphagia management.  Recommend short term follow up at home from SLP.   Impact on safety and function Moderate aspiration risk   CHL IP TREATMENT RECOMMENDATION 05/17/2016 Treatment Recommendations Therapy as outlined in treatment plan below   Prognosis 05/17/2016 Prognosis for Safe Diet Advancement Fair Barriers to Reach Goals Cognitive deficits Barriers/Prognosis Comment -- CHL IP DIET RECOMMENDATION 05/17/2016 SLP Diet Recommendations Regular/thin Liquid Administration via Cup;Straw Medication Administration Whole meds with puree Compensations Slow rate;Small sips/bites;Chin tuck/intermittent cough and dry swallow Postural Changes Upright 30 minutes after meal   CHL IP OTHER RECOMMENDATIONS 05/17/2016 Recommended Consults -- Oral Care Recommendations Oral care BID Other Recommendations --   No flowsheet data found.  CHL IP FREQUENCY AND DURATION 05/17/2016 Speech Therapy Frequency (ACUTE ONLY) min 2x/week Treatment Duration 2 weeks      CHL IP ORAL PHASE 05/17/2016 Oral Phase Impaired Oral - Pudding Teaspoon -- Oral - Pudding Cup -- Oral - Honey Teaspoon -- Oral - Honey Cup -- Oral - Nectar Teaspoon Weak lingual manipulation;Reduced posterior propulsion;Delayed oral transit;Decreased bolus cohesion;Premature spillage Oral - Nectar Cup Delayed oral transit;Decreased bolus cohesion;Piecemeal swallowing;Reduced posterior propulsion;Weak lingual manipulation;Premature spillage Oral - Nectar Straw -- Oral - Thin Teaspoon Weak lingual manipulation;Reduced posterior propulsion;Delayed oral  transit;Decreased bolus cohesion;Premature spillage Oral - Thin Cup Weak lingual manipulation Oral - Thin Straw Weak lingual manipulation Oral - Puree Weak lingual manipulation;Reduced posterior propulsion;Delayed oral transit;Premature spillage;Piecemeal swallowing;Lingual pumping;Decreased bolus cohesion Oral - Mech Soft -- Oral - Regular Weak lingual manipulation;Lingual pumping;Reduced posterior propulsion;Impaired mastication;Delayed oral transit;Decreased bolus cohesion;Piecemeal swallowing;Lingual/palatal residue;Premature spillage Oral - Multi-Consistency -- Oral - Pill Weak lingual manipulation;Premature spillage;Decreased bolus cohesion;Impaired mastication;Reduced posterior propulsion;Piecemeal swallowing;Delayed oral transit Oral Phase - Comment pt with premature spillage of boluses into pharynx uncontrolled, piecemealing  CHL IP PHARYNGEAL PHASE 05/17/2016 Pharyngeal Phase Impaired Pharyngeal- Pudding Teaspoon -- Pharyngeal -- Pharyngeal- Pudding Cup -- Pharyngeal -- Pharyngeal- Honey Teaspoon -- Pharyngeal -- Pharyngeal- Honey Cup -- Pharyngeal -- Pharyngeal- Nectar Teaspoon Delayed swallow initiation-vallecula;Reduced epiglottic inversion;Reduced laryngeal elevation;Reduced airway/laryngeal closure;Penetration/Aspiration during swallow Pharyngeal Material enters airway, remains ABOVE vocal cords and not ejected out Pharyngeal- Nectar Cup Delayed swallow initiation-vallecula;Reduced laryngeal elevation;Reduced airway/laryngeal closure;Reduced tongue base retraction;Reduced epiglottic inversion Pharyngeal -- Pharyngeal- Nectar Straw Reduced anterior  laryngeal mobility;Reduced laryngeal elevation;Reduced airway/laryngeal closure;Penetration/Aspiration during swallow Pharyngeal Material enters airway, remains ABOVE vocal cords and not ejected out Pharyngeal- Thin Teaspoon Delayed swallow initiation-vallecula;Reduced epiglottic inversion;Reduced laryngeal elevation;Reduced airway/laryngeal closure;Reduced  tongue base retraction;Penetration/Aspiration during swallow Pharyngeal Material enters airway, CONTACTS cords and not ejected out Pharyngeal- Thin Cup Delayed swallow initiation-vallecula;Reduced laryngeal elevation;Reduced airway/laryngeal closure;Reduced tongue base retraction;Penetration/Aspiration during swallow;Reduced epiglottic inversion Pharyngeal Material enters airway, CONTACTS cords and not ejected out Pharyngeal- Thin Straw Delayed swallow initiation-vallecula;Reduced epiglottic inversion Pharyngeal -- Pharyngeal- Puree Delayed swallow initiation-vallecula;Reduced epiglottic inversion;Reduced tongue base retraction;Pharyngeal residue - valleculae Pharyngeal -- Pharyngeal- Mechanical Soft -- Pharyngeal -- Pharyngeal- Regular Delayed swallow initiation-vallecula;Pharyngeal residue - valleculae;Reduced epiglottic inversion;Reduced tongue base retraction;Reduced airway/laryngeal closure Pharyngeal -- Pharyngeal- Multi-consistency -- Pharyngeal -- Pharyngeal- Pill Delayed swallow initiation-vallecula;Reduced airway/laryngeal closure;Reduced laryngeal elevation;Reduced tongue base retraction;Penetration/Aspiration during swallow Pharyngeal Material enters airway, remains ABOVE vocal cords and not ejected out Pharyngeal Comment --  CHL IP CERVICAL ESOPHAGEAL PHASE 05/17/2016 Cervical Esophageal Phase WFL Pudding Teaspoon -- Pudding Cup -- Honey Teaspoon -- Honey Cup -- Nectar Teaspoon -- Nectar Cup -- Nectar Straw -- Thin Teaspoon -- Thin Cup -- Thin Straw -- Puree -- Mechanical Soft -- Regular -- Multi-consistency -- Pill -- Cervical Esophageal Comment minimal residuals at proximal esophagus/cervical region at times No flowsheet data found. Chales AbrahamsKimball, Tamara Ann 05/17/2016, 12:59 PM Donavan Burnetamara Kimball, MS [facility] SLP [phone]                Scheduled Meds: . apixaban  5 mg Oral BID  . furosemide  40 mg Intravenous Daily  . lisinopril  5 mg Oral Daily  . metoprolol succinate  200 mg Oral BID  . sodium chloride  flush  3 mL Intravenous Q12H  . traZODone  50 mg Oral QHS  . triamterene-hydrochlorothiazide  1 tablet Oral Daily  . venlafaxine XR  37.5 mg Oral Q breakfast   Continuous Infusions:    LOS: 1 day   Time Spent in minutes   30 minutes  Dajia Gunnels D.O. on 05/18/2016 at 10:51 AM  Between 7am to 7pm - Pager - [phone]  After 7pm go to [email or web address] - password [email or web address]  And look for the night coverage person covering for me after hours  [facility] Office  [phone]

## 2016-05-19 DIAGNOSIS — I495 Sick sinus syndrome: Secondary | ICD-10-CM

## 2016-05-19 DIAGNOSIS — I480 Paroxysmal atrial fibrillation: Secondary | ICD-10-CM

## 2016-05-19 DIAGNOSIS — I10 Essential (primary) hypertension: Secondary | ICD-10-CM

## 2016-05-19 DIAGNOSIS — Z950 Presence of cardiac pacemaker: Secondary | ICD-10-CM

## 2016-05-19 DIAGNOSIS — R0600 Dyspnea, unspecified: Secondary | ICD-10-CM

## 2016-05-19 LAB — BASIC METABOLIC PANEL WITH GFR
Anion gap: 10 (ref 5–15)
BUN: 17 mg/dL (ref 6–20)
CO2: 33 mmol/L — ABNORMAL HIGH (ref 22–32)
Calcium: 9.2 mg/dL (ref 8.9–10.3)
Chloride: 93 mmol/L — ABNORMAL LOW (ref 101–111)
Creatinine, Ser: 0.68 mg/dL (ref 0.44–1.00)
GFR calc Af Amer: >60 mL/min (ref >60)
GFR calc non Af Amer: >60 mL/min (ref >60)
Glucose, Bld: 120 mg/dL — ABNORMAL HIGH (ref 65–99)
Potassium: 3.3 mmol/L — ABNORMAL LOW (ref 3.5–5.1)
Sodium: 136 mmol/L (ref 135–145)

## 2016-05-19 LAB — URINE CULTURE: Culture: >=100000 — AB

## 2016-05-19 LAB — CBC
HCT: 41.6 % (ref 36.0–46.0)
Hemoglobin: 13.5 g/dL (ref 12.0–15.0)
MCH: 28.4 pg (ref 26.0–34.0)
MCHC: 32.5 g/dL (ref 30.0–36.0)
MCV: 87.6 fL (ref 78.0–100.0)
Platelets: 281 K/uL (ref 150–400)
RBC: 4.75 MIL/uL (ref 3.87–5.11)
RDW: 16.9 % — ABNORMAL HIGH (ref 11.5–15.5)
WBC: 9.3 K/uL (ref 4.0–10.5)

## 2016-05-19 MED ORDER — POTASSIUM CHLORIDE CRYS ER 20 MEQ PO TBCR
40.0000 meq | EXTENDED_RELEASE_TABLET | Freq: Two times a day (BID) | ORAL | Status: AC
  Administered 2016-05-19 (×2): 40 meq via ORAL
  Filled 2016-05-19 (×2): qty 2

## 2016-05-19 NOTE — Progress Notes (Signed)
Speech Language Pathology Treatment: Dysphagia  Patient Details Name: Gwendolyn Brown MRN: [id number] DOB: 10/02/1929 Today's Date: 05/19/2016 Time: [id number] SLP Time Calculation (min) (ACUTE ONLY): 23 min  Assessment / Plan / Recommendation Clinical Impression  Pt moderately reclined eating lunch alone when SLP arrived. She required maximum verbal, visual and tactile cues to implement chin tuck which she was unable to appropriately coordinate tuck with swallow 80% of the time. No s/s aspiration however risk is high. Granddaughter arrived and updated on status.RN documentation of lung sounds appears unchanged. Will continue to follow.   HPI HPI: 82 [facility] - fall in bathroom and dyspnea, CXR showed possible right base opacity that may indicate pna vs low lung volume from shallow inspiration.  PMH + for dementia, HTN, cardiac disease s/p pacemaker.   Pt does admit to dysphagia to food/drink occuring approximately once a week causing her to choke.  She also states she has required the heimlich manuever twice and denies prior pneumonias.  MBS ordered by Md.        SLP Plan  Continue with current plan of care     Recommendations  Diet recommendations: Regular;Thin liquid Liquids provided via: Cup;Straw Medication Administration: Whole meds with puree Supervision: Patient able to self feed;Full supervision/cueing for compensatory strategies Compensations: Minimize environmental distractions;Slow rate;Small sips/bites;Chin tuck Postural Changes and/or Swallow Maneuvers: Seated upright 90 degrees;Upright 30-60 min after meal                Oral Care Recommendations: Oral care BID Follow up Recommendations: Home health SLP Plan: Continue with current plan of care       GO                Gwendolyn Brown, Gwendolyn Brown 05/19/2016, 2:56 PM  Gwendolyn CoonsLisa Brown Lonell FaceLitaker M.Ed [employer]-SLP Pager [phone]

## 2016-05-19 NOTE — Progress Notes (Addendum)
PROGRESS NOTE    Gwendolyn Brown  ZOX:[id number] DOB: [birth year] DOA: 05/16/2016 PCP:  Duane Lope, MD   Chief Complaint  Patient presents with  . Shortness of Breath    Brief Narrative:  HPI on 05/16/2016 by Dr. Jacquelin Hawking Gwendolyn Brown is a 82 y.o. female with medical history significant of hypertension, atrial fibrillation on chronic anticoagulation, sick sinus syndrome status post pacer, dementia, depression. About 4 days ago, patient started having mild to moderate dyspnea. She has had associated nonproductive coughing and increasing weakness. She has not taken anything to help with her symptoms. Dyspnea continued to worsen, in addition to patient's increasing weakness, so EMS was called for transport to the [facility] department.  Assessment & Plan   Dyspnea/Weight Gain -CXR Confluent area of airspace opacity at the right lung base, pneumonia cannot be excluded. -BNP Noted to be 341 -Echocardiogram reviewed with EF of 60-65%. Moderate pulmonary hypertension noted. Small pericardial effusion. -Continue on IV lasix -Vital signs reviewed. Uncertain of accuracy of weights as patient's weight noted to have been exactly the same over the past several measurements. Patient however is noted to void well with diuretic. -Repeat basic metabolic panel morning  Accelerated Hypertension/Essential hypertension,uncontrolled -For now, will continue Lasix, lisinopril, metoprolol -Continue with hydralazine PRN  Atrial fibrillation, chronic -CHADSVASC 4 (age, gender, HTN) -Continue Eliquis as tolerated -Early rate controlled  Sick sinus syndrome -has pacemaker  -Sees Dr. Sharrell Ku  Depression -Continue Effexor  Dementia -Per family, may be progressing  -Patient does have short term memory loss  Asymptomatic bacteriuria -UA showed many bacteria, 6-30 WBC, moderate leukocytes, positive nitrites -Urine culture >100K Ecoli -Patient was started on Rocephin. Patient without leukocytosis or fevers.  Patient has received 2 doses of Rocephin thus far and patient is asymptomatic. We'll discontinue for now  Dysphagia -Patient has seen speech as an outpatient -Was supposed to have a modified barium swallow -Barium swallow obtained, recommendations: Regular diet, thin liquid, meds to be given in puree, sit up 30 minutes after eating  Deconditioning -PT and OT consulted- recommendations for skilled nursing facility. -patient has had PT/OT as an outpatient -Discussed case with patient's granddaughter today was agreeable to skilled nursing facility placement.  Hypokalemia -Resovled, Likely secondary to diuresis -Repeat BMP in morning  DVT Prophylaxis  Eliquis  Code Status: DO NOT RESUSCITATE  Family Communication: Patient in room, patient's granddaughter at bedside  Disposition Plan: Anticipate skilled nursing facility and 24-48 hrs.  Consultants None  Procedures  None  Antibiotics   Anti-infectives    Start     Dose/Rate Route Frequency Ordered Stop   05/18/16 1200  cefTRIAXone (ROCEPHIN) 1 g in dextrose 5 % 50 mL IVPB     1 g 100 mL/hr over 30 Minutes Intravenous Every 24 hours 05/18/16 1109        Subjective:   Patient is without complaints today. She is somewhat confused  Objective:   Vitals:   05/19/16 0354 05/19/16 1017 05/19/16 1019 05/19/16 1328  BP: (!) 181/82 (!) 155/74    Pulse: 71  74   Resp: 18     Temp: 98.4 F (36.9 C)     TempSrc: Oral     SpO2: 91%     Weight: 81.6 kg (180 lb)   77.2 kg (170 lb 3.2 oz)  Height:        Intake/Output Summary (Last 24 hours) at 05/19/16 1615 Last data filed at 05/19/16 1511  Gross per 24 hour  Intake  530 ml  Output                0 ml  Net              530 ml   Filed Weights   05/18/16 0559 05/19/16 0354 05/19/16 1328  Weight: 81.6 kg (180 lb) 81.6 kg (180 lb) 77.2 kg (170 lb 3.2 oz)    Exam  General: Well developed,  Lying in bed, no acute distress  HEENT: Dentition fair, trachea  midline  Cardiovascular: irregular, no murmur  Respiratory: Normal respiratory effort, no audible wheezing  Abdomen: Soft, nontender, positive bowel sounds  Extremities: Trace nonpitting lower extremity edema, pulses present, no cyanosis  Skin: Normal skin turgor, no ulcerations   Data Reviewed: I have personally reviewed following labs and imaging studies  CBC:  Recent Labs Lab 05/16/16 0839 05/18/16 0536 05/19/16 0602  WBC 9.4 10.4 9.3  NEUTROABS 7.3  --   --   HGB 13.8 13.9 13.5  HCT 43.1 40.6 41.6  MCV 89.0 84.4 87.6  PLT 275 288 281   Basic Metabolic Panel:  Recent Labs Lab 05/16/16 0839 05/17/16 0519 05/18/16 0536 05/19/16 0602  NA 139 138 135 136  K 3.8 3.2* 3.6 3.3*  CL 101 97* 93* 93*  CO2 27 31 32 33*  GLUCOSE 134* 130* 148* 120*  BUN 14 13 10 17   CREATININE 0.85 0.72 0.76 0.68  CALCIUM 9.3 8.9 9.3 9.2   GFR: Estimated Creatinine Clearance: 49.3 mL/min (by C-G formula based on SCr of 0.68 mg/dL). Liver Function Tests:  Recent Labs Lab 05/16/16 0839  AST 25  ALT 23  ALKPHOS 81  BILITOT 1.2  PROT 6.6  ALBUMIN 3.5   No results for input(s): LIPASE, AMYLASE in the last 168 hours. No results for input(s): AMMONIA in the last 168 hours. Coagulation Profile: No results for input(s): INR, PROTIME in the last 168 hours. Cardiac Enzymes: No results for input(s): CKTOTAL, CKMB, CKMBINDEX, TROPONINI in the last 168 hours. BNP (last 3 results) No results for input(s): PROBNP in the last 8760 hours. HbA1C: No results for input(s): HGBA1C in the last 72 hours. CBG: No results for input(s): GLUCAP in the last 168 hours. Lipid Profile: No results for input(s): CHOL, HDL, LDLCALC, TRIG, CHOLHDL, LDLDIRECT in the last 72 hours. Thyroid Function Tests:  Recent Labs  05/17/16 0519  TSH 0.899   Anemia Panel: No results for input(s): VITAMINB12, FOLATE, FERRITIN, TIBC, IRON, RETICCTPCT in the last 72 hours. Urine analysis:    Component Value  Date/Time   COLORURINE YELLOW 05/16/2016 0810   APPEARANCEUR CLOUDY (A) 05/16/2016 0810   LABSPEC 1.016 05/16/2016 0810   PHURINE 5.5 05/16/2016 0810   GLUCOSEU NEGATIVE 05/16/2016 0810   HGBUR MODERATE (A) 05/16/2016 0810   BILIRUBINUR NEGATIVE 05/16/2016 0810   KETONESUR NEGATIVE 05/16/2016 0810   PROTEINUR NEGATIVE 05/16/2016 0810   NITRITE POSITIVE (A) 05/16/2016 0810   LEUKOCYTESUR MODERATE (A) 05/16/2016 0810   Sepsis Labs: @LABRCNTIP (procalcitonin:4,lacticidven:4)  ) Recent Results (from the past 240 hour(s))  Culture, Urine     Status: Abnormal   Collection Time: 05/16/16  8:10 AM  Result Value Ref Range Status   Specimen Description URINE, CLEAN CATCH  Final   Special Requests NONE  Final   Culture >=100,000 COLONIES/mL ESCHERICHIA COLI (A)  Final   Report Status 05/19/2016 FINAL  Final   Organism ID, Bacteria ESCHERICHIA COLI (A)  Final      Susceptibility   Escherichia coli -  MIC*    AMPICILLIN >=32 RESISTANT Resistant     CEFAZOLIN <=4 SENSITIVE Sensitive     CEFTRIAXONE <=1 SENSITIVE Sensitive     CIPROFLOXACIN <=0.25 SENSITIVE Sensitive     GENTAMICIN <=1 SENSITIVE Sensitive     IMIPENEM <=0.25 SENSITIVE Sensitive     NITROFURANTOIN <=16 SENSITIVE Sensitive     TRIMETH/SULFA <=20 SENSITIVE Sensitive     AMPICILLIN/SULBACTAM 16 INTERMEDIATE Intermediate     PIP/TAZO <=4 SENSITIVE Sensitive     Extended ESBL NEGATIVE Sensitive     * >=100,000 COLONIES/mL ESCHERICHIA COLI      Radiology Studies: No results found.   Scheduled Meds: . apixaban  5 mg Oral BID  . cefTRIAXone (ROCEPHIN)  IV  1 g Intravenous Q24H  . furosemide  40 mg Intravenous Daily  . lisinopril  5 mg Oral Daily  . metoprolol succinate  200 mg Oral BID  . potassium chloride  40 mEq Oral BID  . sodium chloride flush  3 mL Intravenous Q12H  . traZODone  50 mg Oral QHS  . triamterene-hydrochlorothiazide  1 tablet Oral Daily  . venlafaxine XR  37.5 mg Oral Q breakfast   Continuous  Infusions:    LOS: 2 days    CHIU, STEPHEN K MD on 05/19/2016 at 4:15 PM  Between 7am to 7pm - Pager - [phone]  [facility] Office  [phone]

## 2016-05-20 DIAGNOSIS — R8271 Bacteriuria: Secondary | ICD-10-CM

## 2016-05-20 DIAGNOSIS — F0390 Unspecified dementia without behavioral disturbance: Secondary | ICD-10-CM

## 2016-05-20 LAB — GLUCOSE, CAPILLARY: GLUCOSE-CAPILLARY: 145 mg/dL — AB (ref 65–99)

## 2016-05-20 LAB — BASIC METABOLIC PANEL
ANION GAP: 9 (ref 5–15)
BUN: 17 mg/dL (ref 6–20)
CHLORIDE: 94 mmol/L — AB (ref 101–111)
CO2: 31 mmol/L (ref 22–32)
CREATININE: 0.78 mg/dL (ref 0.44–1.00)
Calcium: 9.3 mg/dL (ref 8.9–10.3)
GFR calc non Af Amer: >60 mL/min (ref >60)
Glucose, Bld: 136 mg/dL — ABNORMAL HIGH (ref 65–99)
POTASSIUM: 4.3 mmol/L (ref 3.5–5.1)
SODIUM: 134 mmol/L — AB (ref 135–145)

## 2016-05-20 NOTE — Progress Notes (Signed)
PROGRESS NOTE    Ephrata Verville  WUJ:[id number] DOB: Nov 26, 1929 DOA: 05/16/2016 PCP:  Duane Lope, MD   Chief Complaint  Patient presents with  . Shortness of Breath    Brief Narrative:  HPI on 05/16/2016 by Dr. Jacquelin Hawking Alee Gressman is a 82 y.o. female with medical history significant of hypertension, atrial fibrillation on chronic anticoagulation, sick sinus syndrome status post pacer, dementia, depression. About 4 days ago, patient started having mild to moderate dyspnea. She has had associated nonproductive coughing and increasing weakness. She has not taken anything to help with her symptoms. Dyspnea continued to worsen, in addition to patient's increasing weakness, so EMS was called for transport to the [facility] department.  Assessment & Plan   Dyspnea/Weight Gain -CXR Confluent area of airspace opacity at the right lung base, pneumonia cannot be excluded. -BNP Found to be 341 -Echocardiogram reviewed with EF of 60-65%. Moderate pulmonary hypertension noted. Small pericardial effusion. -Patient is continued on IV lasix -Weight today: 77.2 kg -Weight on admission: 81.647 kg -Will repeat basic metabolic panel morning  Accelerated Hypertension/Essential hypertension,uncontrolled -Patient is continued on Lasix, lisinopril, metoprolol -Continue with hydralazine PRN Stable as far  Atrial fibrillation, chronic -CHADSVASC 4 (age, gender, HTN) -Continue Eliquis as tolerated -Presently rate controlled  Sick sinus syndrome -has pacemaker  -Sees Dr. Sharrell Ku  Depression -Continue Effexor  Dementia -Per family, may be progressing  -Patient does have evidence of short term memory loss  Asymptomatic bacteriuria -UA showed many bacteria, 6-30 WBC, moderate leukocytes, positive nitrites -Urine culture >100K Ecoli -Patient was started on Rocephin. Patient without leukocytosis or fevers. Patient has received 2 doses of Rocephin thus far and patient is asymptomatic. Rocephin was  discontinued on 05/19/2016  Dysphagia -Patient has seen speech as an outpatient -Was supposed to have a modified barium swallow -Barium swallow obtained, recommendations: Regular diet, thin liquid, meds to be given in puree, sit up 30 minutes after eating  Deconditioning -PT and OT consulted- recommendations for skilled nursing facility. -patient has had PT/OT as an outpatient -Anticipate skilled nursing placement  Hypokalemia -Resovled, Likely secondary to diuresis -We'll repeat basic metabolic panel in morning  DVT Prophylaxis  Eliquis  Code Status: DO NOT RESUSCITATE  Family Communication: Patient in room, patient's daughter at bedside  Disposition Plan: Anticipate skilled nursing facility and 24-48 hrs.  Consultants None  Procedures  None  Antibiotics   Anti-infectives    Start     Dose/Rate Route Frequency Ordered Stop   05/18/16 1200  cefTRIAXone (ROCEPHIN) 1 g in dextrose 5 % 50 mL IVPB  Status:  Discontinued     1 g 100 mL/hr over 30 Minutes Intravenous Every 24 hours 05/18/16 1109 05/19/16 1623      Subjective:   Patient is pleasantly confused today. Denies chest pain or shortness of breath  Objective:   Vitals:   05/19/16 1328 05/19/16 2141 05/20/16 0633 05/20/16 1438  BP:  (!) 173/84 (!) 151/79 (!) 160/88  Pulse:  75 70 70  Resp:  18 18 18   Temp:  98.1 F (36.7 C) 97.7 F (36.5 C) 98.1 F (36.7 C)  TempSrc:  Oral Oral Oral  SpO2:  94% 95% 94%  Weight: 77.2 kg (170 lb 3.2 oz)     Height:        Intake/Output Summary (Last 24 hours) at 05/20/16 1652 Last data filed at 05/20/16 0900  Gross per 24 hour  Intake              240  ml  Output                0 ml  Net              240 ml   Filed Weights   05/18/16 0559 05/19/16 0354 05/19/16 1328  Weight: 81.6 kg (180 lb) 81.6 kg (180 lb) 77.2 kg (170 lb 3.2 oz)    Exam  General: Awake, conversant, lying in bed  HEENT: Neck supple, pupils equal round reactive bilaterally  Cardiovascular:  Irregularly irregular, S1-S2  Respiratory: No crackles, normal respiratory effort, good chest expansion  Abdomen: Nondistended, positive bowel sounds, no organomegaly or masses  Extremities: Minimal lower extremity edema, no clubbing  Skin: Skin turgor normal, no rashes   Data Reviewed: I have personally reviewed following labs and imaging studies  CBC:  Recent Labs Lab 05/16/16 0839 05/18/16 0536 05/19/16 0602  WBC 9.4 10.4 9.3  NEUTROABS 7.3  --   --   HGB 13.8 13.9 13.5  HCT 43.1 40.6 41.6  MCV 89.0 84.4 87.6  PLT 275 288 281   Basic Metabolic Panel:  Recent Labs Lab 05/16/16 0839 05/17/16 0519 05/18/16 0536 05/19/16 0602 05/20/16 0602  NA 139 138 135 136 134*  K 3.8 3.2* 3.6 3.3* 4.3  CL 101 97* 93* 93* 94*  CO2 27 31 32 33* 31  GLUCOSE 134* 130* 148* 120* 136*  BUN 14 13 10 17 17   CREATININE 0.85 0.72 0.76 0.68 0.78  CALCIUM 9.3 8.9 9.3 9.2 9.3   GFR: Estimated Creatinine Clearance: 49.3 mL/min (by C-G formula based on SCr of 0.78 mg/dL). Liver Function Tests:  Recent Labs Lab 05/16/16 0839  AST 25  ALT 23  ALKPHOS 81  BILITOT 1.2  PROT 6.6  ALBUMIN 3.5   No results for input(s): LIPASE, AMYLASE in the last 168 hours. No results for input(s): AMMONIA in the last 168 hours. Coagulation Profile: No results for input(s): INR, PROTIME in the last 168 hours. Cardiac Enzymes: No results for input(s): CKTOTAL, CKMB, CKMBINDEX, TROPONINI in the last 168 hours. BNP (last 3 results) No results for input(s): PROBNP in the last 8760 hours. HbA1C: No results for input(s): HGBA1C in the last 72 hours. CBG:  Recent Labs Lab 05/20/16 0457  GLUCAP 145*   Lipid Profile: No results for input(s): CHOL, HDL, LDLCALC, TRIG, CHOLHDL, LDLDIRECT in the last 72 hours. Thyroid Function Tests: No results for input(s): TSH, T4TOTAL, FREET4, T3FREE, THYROIDAB in the last 72 hours. Anemia Panel: No results for input(s): VITAMINB12, FOLATE, FERRITIN, TIBC, IRON,  RETICCTPCT in the last 72 hours. Urine analysis:    Component Value Date/Time   COLORURINE YELLOW 05/16/2016 0810   APPEARANCEUR CLOUDY (A) 05/16/2016 0810   LABSPEC 1.016 05/16/2016 0810   PHURINE 5.5 05/16/2016 0810   GLUCOSEU NEGATIVE 05/16/2016 0810   HGBUR MODERATE (A) 05/16/2016 0810   BILIRUBINUR NEGATIVE 05/16/2016 0810   KETONESUR NEGATIVE 05/16/2016 0810   PROTEINUR NEGATIVE 05/16/2016 0810   NITRITE POSITIVE (A) 05/16/2016 0810   LEUKOCYTESUR MODERATE (A) 05/16/2016 0810   Sepsis Labs: @LABRCNTIP (procalcitonin:4,lacticidven:4)  ) Recent Results (from the past 240 hour(s))  Culture, Urine     Status: Abnormal   Collection Time: 05/16/16  8:10 AM  Result Value Ref Range Status   Specimen Description URINE, CLEAN CATCH  Final   Special Requests NONE  Final   Culture >=100,000 COLONIES/mL ESCHERICHIA COLI (A)  Final   Report Status 05/19/2016 FINAL  Final   Organism ID, Bacteria ESCHERICHIA COLI (  A)  Final      Susceptibility   Escherichia coli - MIC*    AMPICILLIN >=32 RESISTANT Resistant     CEFAZOLIN <=4 SENSITIVE Sensitive     CEFTRIAXONE <=1 SENSITIVE Sensitive     CIPROFLOXACIN <=0.25 SENSITIVE Sensitive     GENTAMICIN <=1 SENSITIVE Sensitive     IMIPENEM <=0.25 SENSITIVE Sensitive     NITROFURANTOIN <=16 SENSITIVE Sensitive     TRIMETH/SULFA <=20 SENSITIVE Sensitive     AMPICILLIN/SULBACTAM 16 INTERMEDIATE Intermediate     PIP/TAZO <=4 SENSITIVE Sensitive     Extended ESBL NEGATIVE Sensitive     * >=100,000 COLONIES/mL ESCHERICHIA COLI      Radiology Studies: No results found.   Scheduled Meds: . apixaban  5 mg Oral BID  . furosemide  40 mg Intravenous Daily  . lisinopril  5 mg Oral Daily  . metoprolol succinate  200 mg Oral BID  . sodium chloride flush  3 mL Intravenous Q12H  . traZODone  50 mg Oral QHS  . triamterene-hydrochlorothiazide  1 tablet Oral Daily  . venlafaxine XR  37.5 mg Oral Q breakfast   Continuous Infusions:    LOS: 3 days      Aanchal Cope K MD on 05/20/2016 at 4:52 PM  Between 7am to 7pm - Pager - [phone]  [facility] Office  [phone]

## 2016-05-20 NOTE — Progress Notes (Signed)
Physical Therapy Treatment Patient Details Name: Gwendolyn Brown MRN: [id number] DOB: 01/13/1930 Today's Date: 05/20/2016    History of Present Illness 82 yo female admitted with dyspnea. Hx of HTN, A fib, SSS, pacemaker, dementia.     PT Comments    [facility] only as pt was unable to completely stand and support self enough to attempt amb.  Limited by cognition and limited by weakness.   Follow Up Recommendations  Home health PT;Supervision/Assistance - 3924 hour;SNF (daughter looking at facilities)     Equipment Recommendations       Recommendations for Other Services       Precautions / Restrictions Precautions Precautions: Fall Precaution Comments: B knee arthritis Restrictions Weight Bearing Restrictions: No Other Position/Activity Restrictions: WBAT    Mobility  Bed Mobility Overal bed mobility: Needs Assistance Bed Mobility: Supine to Sit     Supine to sit: HOB elevated;Max assist;Total assist     General bed mobility comments: Repeat functional VC's to complete task and stay focused.  utilized bed pad to complete scooting to EOB.   Transfers Overall transfer level: Needs assistance Equipment used: Rolling walker (2 wheeled) Transfers: Sit to/from [employer];Stand Pivot Transfers Sit to Stand: Max assist;Total assist;+2 physical assistance;+2 safety/equipment;From elevated surface Stand pivot transfers: Max assist;Total assist;+2 physical assistance;+2 safety/equipment;From elevated surface       General transfer comment: + 2 assist partial stance and incomplete pivot [facility]  Pt demonstrated impaired cognition to task.  required increased assist to complete turn.  Ambulation/Gait             General Gait Details: unable to attempt due to poor transfer ability.   Stairs            Wheelchair Mobility    Modified Rankin (Stroke Patients Only)       Balance                                     Cognition Arousal/Alertness: Awake/alert Behavior During Therapy: WFL for tasks assessed/performed Overall Cognitive Status: Impaired/Different from baseline Area of Impairment: Orientation;Attention;Memory;Following commands               General Comments: noted cognitive impairment inhibited session.      Exercises      General Comments        Pertinent Vitals/Pain Pain Assessment: 0-10 Faces Pain Scale: Hurts little more Pain Descriptors / Indicators: Aching;Constant Pain Intervention(s): Monitored during session;Repositioned    Home Living                      Prior Function            PT Goals (current goals can now be found in the care plan section) Progress towards PT goals: Progressing toward goals    Frequency    Min 3X/week      PT Plan Current plan remains appropriate    Co-evaluation             End of Session Equipment Utilized During Treatment: Gait belt Activity Tolerance: Other (comment) (limited by cognition) Patient left: in chair;with call bell/phone within reach;with chair alarm set     Time: 1042-1110 PT Time Calculation (min) (ACUTE ONLY): 28 min  Charges:  $Gait Training: 8-22 mins $Therapeutic Activity: 8-22 mins  G Codes:      Rica Koyanagi  PTA [facility]  Acute  Rehab Pager      [phone]

## 2016-05-20 NOTE — Progress Notes (Signed)
LCSWA met with patient and daughter at bedside and discussed patient disposition . LCSWA provided patient daughter with requested medicare checklist and discharge list to use as she visits SNF facilities.

## 2016-05-21 LAB — BASIC METABOLIC PANEL
Anion gap: 10 (ref 5–15)
BUN: 25 mg/dL — ABNORMAL HIGH (ref 6–20)
CHLORIDE: 96 mmol/L — AB (ref 101–111)
CO2: 30 mmol/L (ref 22–32)
CREATININE: 0.79 mg/dL (ref 0.44–1.00)
Calcium: 9.4 mg/dL (ref 8.9–10.3)
GFR calc non Af Amer: >60 mL/min (ref >60)
Glucose, Bld: 133 mg/dL — ABNORMAL HIGH (ref 65–99)
POTASSIUM: 3.8 mmol/L (ref 3.5–5.1)
SODIUM: 136 mmol/L (ref 135–145)

## 2016-05-21 MED ORDER — FUROSEMIDE 40 MG PO TABS
40.0000 mg | ORAL_TABLET | Freq: Every day | ORAL | Status: DC
  Administered 2016-05-21: 40 mg via ORAL
  Filled 2016-05-21: qty 1

## 2016-05-21 MED ORDER — LISINOPRIL 10 MG PO TABS: 10.0000 mg | ORAL_TABLET | Freq: Every day | ORAL | 0 refills | Status: AC

## 2016-05-21 MED ORDER — OXYCODONE HCL 5 MG PO TABS: 5.0000 mg | ORAL_TABLET | ORAL | 0 refills | Status: AC | PRN

## 2016-05-21 MED ORDER — FUROSEMIDE 40 MG PO TABS: 40.0000 mg | ORAL_TABLET | Freq: Every day | ORAL | 0 refills | Status: AC

## 2016-05-21 MED ORDER — LISINOPRIL 10 MG PO TABS
10.0000 mg | ORAL_TABLET | Freq: Every day | ORAL | Status: DC
  Administered 2016-05-21: 10 mg via ORAL
  Filled 2016-05-21: qty 1

## 2016-05-21 NOTE — Progress Notes (Signed)
LCSWA discussed patient disposition and care plan with daughter. [facility]The daughter reqest to meet with physician to learn about the patients new medications/ future care plan. [facility] will [facility]/c today per physician.

## 2016-05-21 NOTE — Progress Notes (Signed)
[id number]/[employer] [place],BSN,RN3,CCM: discharge orders checked for cm needs./no needs present at time of discharge/patient going to snf/.[phone]

## 2016-05-21 NOTE — Discharge Summary (Signed)
Physician Discharge Summary  Gwendolyn Brown ZOX:[id number] DOB: 07/29/1930 DOA: 05/16/2016  PCP:  [name], [name], MD  Admit date: 05/16/2016 Discharge date: 05/21/2016  Admitted From: Home Disposition:  SNF  Recommendations for Outpatient Follow-up:  1. Follow up with PCP in 1-2 weeks 2. Please obtain BMP in one week 3. Wt on discharge: 74.2kg. Please notify physician if over 5 pound wt gain in 7 days   Discharge Condition:Improved CODE STATUS:DNR Diet recommendation: Regular diet, thin liquid, meds to be given in puree, sit up 30 minutes after eating  Brief/Interim Summary: 82 y.o.femalewith medical history significant of hypertension, atrial fibrillation on chronic anticoagulation, sick sinus syndrome status post pacer, dementia, depression. About 4 days ago, patient started having mild to moderate dyspnea. She has had associated nonproductive coughing and increasing weakness. She has not taken anything to help with her symptoms. Dyspnea continued to worsen,in addition to patient's increasing weakness, so [facility]  Dyspnea/Weight Gain -CXR Confluent area of airspace opacity at the right lung base, pneumonia cannot be excluded. -BNP Found to be 341 -Echocardiogram reviewed with EF of 60-65%. Moderate pulmonary hypertension noted. Small pericardial effusion. -Patient was continued on IV lasix, later transitioned to PO on day of discharge -Weight on admission: 81.647 kg. Discharge wt noted above -Recommend repeat basic metabolic panel in one week  Accelerated Hypertension/Essential hypertension,uncontrolled -Patient is continued on Lasix, lisinopril, metoprolol -Lisinopril dose later increased to 10mg  given suboptimally controlled BP  Atrial fibrillation, chronic -CHADSVASC 4 (age, gender, HTN) -Continued Eliquis as tolerated -Presently rate controlled  Sick sinus syndrome -has pacemaker  -Sees Dr. Sharrell KuGreg Taylor  Depression -Continue  Effexor  Dementia -Per family, may be progressing  -Patient does have evidence of short term memory loss  Asymptomatic bacteriuria -UA showed many bacteria, 6-30 WBC, moderate leukocytes, positive nitrites -Urine culture >100K Ecoli -Patient was started on Rocephin. Patient without leukocytosis or fevers. Patient has received 2 doses of Rocephin thus far and patient is asymptomatic. Rocephin was discontinued on 05/19/2016 and patient had remained stable since  Dysphagia -Patient has seen speech as an outpatient -Was supposed to have a modified barium swallow -Barium swallow obtained, recommendations: Regular diet, thin liquid, meds to be given in puree, sit up 30 minutes after eating  Deconditioning -PT and OT consulted- recommendations for skilled nursing facility. -patient has had PT/OT as an outpatient -Plan skilled nursing placement  Hypokalemia -Resovled, Likely secondary to diuresis  DVT Prophylaxis  Eliquis  Discharge Diagnoses:  Principal Problem:   Dyspnea Active Problems:   Atrial fibrillation (HCC)   Dementia   Pacemaker   Hypertension   Sick sinus syndrome (HCC)   Hypertensive urgency   Asymptomatic bacteriuria   UTI (lower urinary tract infection)    Discharge Instructions     Medication List    TAKE these medications   acetaminophen 500 MG tablet Commonly known as:  TYLENOL Take 500 mg by mouth every 6 (six) hours as needed for mild pain.   b complex vitamins tablet Take 1 tablet by mouth daily.   docusate sodium 100 MG capsule Commonly known as:  COLACE Take 100 mg by mouth daily as needed for mild constipation.   ELIQUIS 5 MG Tabs tablet Generic drug:  apixaban Take 5 mg by mouth 2 (two) times daily.   furosemide 40 MG tablet Commonly known as:  LASIX Take 1 tablet (40 mg total) by mouth daily.   lisinopril 10 MG tablet Commonly known as:  PRINIVIL,ZESTRIL Take 1  tablet (10 mg total) by mouth daily. Start taking on:   05/22/2016   metoprolol succinate 100 MG 24 hr tablet Commonly known as:  TOPROL-XL Take 200 mg by mouth 2 (two) times daily.   oxyCODONE 5 MG immediate release tablet Commonly known as:  Oxy IR/ROXICODONE Take 5 mg by mouth every 4 (four) hours as needed for severe pain or breakthrough pain. What changed:  Another medication with the same name was added. Make sure you understand how and when to take each.   oxyCODONE 5 MG immediate release tablet Commonly known as:  Oxy IR/ROXICODONE Take 1 tablet (5 mg total) by mouth every 4 (four) hours as needed for severe pain or breakthrough pain. What changed:  You were already taking a medication with the same name, and this prescription was added. Make sure you understand how and when to take each.   traZODone 50 MG tablet Commonly known as:  DESYREL Take 50 mg by mouth at bedtime.   triamterene-hydrochlorothiazide 37.5-25 MG tablet Commonly known as:  MAXZIDE-25 Take 1 tablet by mouth daily.   venlafaxine XR 37.5 MG 24 hr capsule Commonly known as:  EFFEXOR-XR Take 37.5 mg by mouth daily with breakfast.      Follow-up Information     Duane Lope, MD. Schedule an appointment as soon as possible for a visit in 1 week(s).   Specialty:  Family Medicine Contact information: [address] [place] [address] [phone]          Allergies  Allergen Reactions  . Codeine Rash    Procedures/Studies: Dg Chest 2 View  Result Date: 05/16/2016 CLINICAL DATA:  Patient fell this morning. Complaining of of increased weakness and dyspnea for the past 3 days. EXAM: CHEST  2 VIEW COMPARISON:  02/23/2016 FINDINGS: Lung volumes are low suggesting shallow inspiration. Crowding of the interstitial lung markings and bibasilar atelectasis result. Slight increasing airspace opacity at the right lung base cannot entirely exclude a small infiltrate. No overt pulmonary edema nor effusion. The heart is normal in size. Left-sided pacemaker  apparatus projects over the costophrenic angle with right atrial and right ventricular leads in place. Aorta is not aneurysmal. There is osteoarthritic joint space narrowing of both glenohumeral joints with inferomedial osteophytes off the humeral heads. Dystrophic calcifications are seen along the course of the rotator cuff bilaterally. IMPRESSION: Confluent area of airspace opacity at the right lung base for which a small pneumonia cannot be entirely excluded but findings are believed to be more likely from shallow inspiration and low lung volumes. Bilateral calcific rotator cuff tendinopathy. Electronically Signed   By: Tollie Eth M.D.   On: 05/16/2016 08:38   Dg Swallowing Func-speech Pathology  Result Date: 05/17/2016 Objective Swallowing Evaluation: Type of Study: Bedside Swallow Evaluation Patient Details Name: Kaegan Stigler MRN: [id number] Date of Birth: [birth year] Today's Date: 05/17/2016 Time: SLP Start Time (ACUTE ONLY): 1150-SLP Stop Time (ACUTE ONLY): 1225 SLP Time Calculation (min) (ACUTE ONLY): 35 min Past Medical History: Past Medical History: Diagnosis Date . Atrial fibrillation (HCC)  . Dementia  . Depression, major, in partial remission (HCC)  . Edema  . Hypertension  . Pacemaker  . Weakness generalized  Past Surgical History: Past Surgical History: Procedure Laterality Date . GALLBLADDER SURGERY   . PACEMAKER INSERTION   . TONSILLECTOMY   HPI: 82 yo female adm to [facility] wtih weakness - fall in bathroom and dyspnea, CXR showed possible right base opacity that may indicate pna vs low lung volume from shallow  inspiration.  PMH + for dementia, HTN, cardiac disease s/p pacemaker.   Pt does admit to dysphagia to food/drink occuring approximately once a week causing her to choke.  She also states she has required the heimlich manuever twice and denies prior pneumonias.  MBS ordered by Md.   Subjective: pt awake in chair Assessment / Plan / Recommendation CHL IP CLINICAL IMPRESSIONS 05/17/2016 Therapy  Diagnosis Moderate oral phase dysphagia;Mild pharyngeal phase dysphagia Clinical Impression Pt presents with moderate oral and mild pharyngeal dysphagia with sensorimotor deficits.  Pt with premature spillge of barium into pharynx poorly controlled and oral residuals due to weakness.   Anatomically epiglottis was curved anteriorally which at times obliterated vallecular space and allowed barium to spill over epiglottis into larynx.  Pt with consistent laryngeal penetration of thin/nectar liquids and aspiration of first bolus of nectar.  Aspiration followed by very subtle cough response.  Chin tuck posture helped to mitigate pt's dysphagia and prevent aspiration.  Using live video and teach back, educated pt to findings/recommendations.    SLP to follow up for dysphagia management.  Recommend short term follow up at home from SLP.   Impact on safety and function Moderate aspiration risk   CHL IP TREATMENT RECOMMENDATION 05/17/2016 Treatment Recommendations Therapy as outlined in treatment plan below   Prognosis 05/17/2016 Prognosis for Safe Diet Advancement Fair Barriers to Reach Goals Cognitive deficits Barriers/Prognosis Comment -- CHL IP DIET RECOMMENDATION 05/17/2016 SLP Diet Recommendations Regular/thin Liquid Administration via Cup;Straw Medication Administration Whole meds with puree Compensations Slow rate;Small sips/bites;Chin tuck/intermittent cough and dry swallow Postural Changes Upright 30 minutes after meal   CHL IP OTHER RECOMMENDATIONS 05/17/2016 Recommended Consults -- Oral Care Recommendations Oral care BID Other Recommendations --   No flowsheet data found.  CHL IP FREQUENCY AND DURATION 05/17/2016 Speech Therapy Frequency (ACUTE ONLY) min 2x/week Treatment Duration 2 weeks      CHL IP ORAL PHASE 05/17/2016 Oral Phase Impaired Oral - Pudding Teaspoon -- Oral - Pudding Cup -- Oral - Honey Teaspoon -- Oral - Honey Cup -- Oral - Nectar Teaspoon Weak lingual manipulation;Reduced posterior propulsion;Delayed  oral transit;Decreased bolus cohesion;Premature spillage Oral - Nectar Cup Delayed oral transit;Decreased bolus cohesion;Piecemeal swallowing;Reduced posterior propulsion;Weak lingual manipulation;Premature spillage Oral - Nectar Straw -- Oral - Thin Teaspoon Weak lingual manipulation;Reduced posterior propulsion;Delayed oral transit;Decreased bolus cohesion;Premature spillage Oral - Thin Cup Weak lingual manipulation Oral - Thin Straw Weak lingual manipulation Oral - Puree Weak lingual manipulation;Reduced posterior propulsion;Delayed oral transit;Premature spillage;Piecemeal swallowing;Lingual pumping;Decreased bolus cohesion Oral - Mech Soft -- Oral - Regular Weak lingual manipulation;Lingual pumping;Reduced posterior propulsion;Impaired mastication;Delayed oral transit;Decreased bolus cohesion;Piecemeal swallowing;Lingual/palatal residue;Premature spillage Oral - Multi-Consistency -- Oral - Pill Weak lingual manipulation;Premature spillage;Decreased bolus cohesion;Impaired mastication;Reduced posterior propulsion;Piecemeal swallowing;Delayed oral transit Oral Phase - Comment pt with premature spillage of boluses into pharynx uncontrolled, piecemealing  CHL IP PHARYNGEAL PHASE 05/17/2016 Pharyngeal Phase Impaired Pharyngeal- Pudding Teaspoon -- Pharyngeal -- Pharyngeal- Pudding Cup -- Pharyngeal -- Pharyngeal- Honey Teaspoon -- Pharyngeal -- Pharyngeal- Honey Cup -- Pharyngeal -- Pharyngeal- Nectar Teaspoon Delayed swallow initiation-vallecula;Reduced epiglottic inversion;Reduced laryngeal elevation;Reduced airway/laryngeal closure;Penetration/Aspiration during swallow Pharyngeal Material enters airway, remains ABOVE vocal cords and not ejected out Pharyngeal- Nectar Cup Delayed swallow initiation-vallecula;Reduced laryngeal elevation;Reduced airway/laryngeal closure;Reduced tongue base retraction;Reduced epiglottic inversion Pharyngeal -- Pharyngeal- Nectar Straw Reduced anterior laryngeal mobility;Reduced  laryngeal elevation;Reduced airway/laryngeal closure;Penetration/Aspiration during swallow Pharyngeal Material enters airway, remains ABOVE vocal cords and not ejected out Pharyngeal- Thin Teaspoon Delayed swallow initiation-vallecula;Reduced epiglottic inversion;Reduced laryngeal elevation;Reduced airway/laryngeal closure;Reduced tongue  base retraction;Penetration/Aspiration during swallow Pharyngeal Material enters airway, CONTACTS cords and not ejected out Pharyngeal- Thin Cup Delayed swallow initiation-vallecula;Reduced laryngeal elevation;Reduced airway/laryngeal closure;Reduced tongue base retraction;Penetration/Aspiration during swallow;Reduced epiglottic inversion Pharyngeal Material enters airway, CONTACTS cords and not ejected out Pharyngeal- Thin Straw Delayed swallow initiation-vallecula;Reduced epiglottic inversion Pharyngeal -- Pharyngeal- Puree Delayed swallow initiation-vallecula;Reduced epiglottic inversion;Reduced tongue base retraction;Pharyngeal residue - valleculae Pharyngeal -- Pharyngeal- Mechanical Soft -- Pharyngeal -- Pharyngeal- Regular Delayed swallow initiation-vallecula;Pharyngeal residue - valleculae;Reduced epiglottic inversion;Reduced tongue base retraction;Reduced airway/laryngeal closure Pharyngeal -- Pharyngeal- Multi-consistency -- Pharyngeal -- Pharyngeal- Pill Delayed swallow initiation-vallecula;Reduced airway/laryngeal closure;Reduced laryngeal elevation;Reduced tongue base retraction;Penetration/Aspiration during swallow Pharyngeal Material enters airway, remains ABOVE vocal cords and not ejected out Pharyngeal Comment --  CHL IP CERVICAL ESOPHAGEAL PHASE 05/17/2016 Cervical Esophageal Phase WFL Pudding Teaspoon -- Pudding Cup -- Honey Teaspoon -- Honey Cup -- Nectar Teaspoon -- Nectar Cup -- Nectar Straw -- Thin Teaspoon -- Thin Cup -- Thin Straw -- Puree -- Mechanical Soft -- Regular -- Multi-consistency -- Pill -- Cervical Esophageal Comment minimal residuals at proximal  esophagus/cervical region at times No flowsheet data found. Chales Abrahams 05/17/2016, 12:59 PM Donavan Burnet, MS [facility] SLP [phone]               Subjective: No complaints  Discharge Exam: Vitals:   05/21/16 0547 05/21/16 0750  BP: (!) 177/80 (!) 168/96  Pulse: 71 70  Resp: 19 16  Temp: 98.6 F (37 C) 97.6 F (36.4 C)   Vitals:   05/20/16 1438 05/20/16 2105 05/21/16 0547 05/21/16 0750  BP: (!) 160/88 (!) 161/73 (!) 177/80 (!) 168/96  Pulse: 70 71 71 70  Resp: 18 19 19 16   Temp: 98.1 F (36.7 C) 98.5 F (36.9 C) 98.6 F (37 C) 97.6 F (36.4 C)  TempSrc: Oral Oral Oral Oral  SpO2: 94% 93% 93% 93%  Weight:   74.2 kg (163 lb 9.3 oz)   Height:        General: Pt is alert, awake, not in acute distress Cardiovascular: RRR, S1/S2 +, no rubs, no gallops Respiratory: CTA bilaterally, no wheezing, no rhonchi Abdominal: Soft, NT, ND, bowel sounds + Extremities: no edema, no cyanosis   The results of significant diagnostics from this hospitalization (including imaging, microbiology, ancillary and laboratory) are listed below for reference.     Microbiology: Recent Results (from the past 240 hour(s))  Culture, Urine     Status: Abnormal   Collection Time: 05/16/16  8:10 AM  Result Value Ref Range Status   Specimen Description URINE, CLEAN CATCH  Final   Special Requests NONE  Final   Culture >=100,000 COLONIES/mL ESCHERICHIA COLI (A)  Final   Report Status 05/19/2016 FINAL  Final   Organism ID, Bacteria ESCHERICHIA COLI (A)  Final      Susceptibility   Escherichia coli - MIC*    AMPICILLIN >=32 RESISTANT Resistant     CEFAZOLIN <=4 SENSITIVE Sensitive     CEFTRIAXONE <=1 SENSITIVE Sensitive     CIPROFLOXACIN <=0.25 SENSITIVE Sensitive     GENTAMICIN <=1 SENSITIVE Sensitive     IMIPENEM <=0.25 SENSITIVE Sensitive     NITROFURANTOIN <=16 SENSITIVE Sensitive     TRIMETH/SULFA <=20 SENSITIVE Sensitive     AMPICILLIN/SULBACTAM 16 INTERMEDIATE Intermediate     PIP/TAZO  <=4 SENSITIVE Sensitive     Extended ESBL NEGATIVE Sensitive     * >=100,000 COLONIES/mL ESCHERICHIA COLI     Labs: BNP (last 3 results)  Recent Labs  05/16/16 0839  BNP 341.5*   Basic Metabolic Panel:  Recent Labs Lab 05/17/16 0519 05/18/16 0536 05/19/16 0602 05/20/16 0602 05/21/16 0600  NA 138 135 136 134* 136  K 3.2* 3.6 3.3* 4.3 3.8  CL 97* 93* 93* 94* 96*  CO2 31 32 33* 31 30  GLUCOSE 130* 148* 120* 136* 133*  BUN 13 10 17 17  25*  CREATININE 0.72 0.76 0.68 0.78 0.79  CALCIUM 8.9 9.3 9.2 9.3 9.4   Liver Function Tests:  Recent Labs Lab 05/16/16 0839  AST 25  ALT 23  ALKPHOS 81  BILITOT 1.2  PROT 6.6  ALBUMIN 3.5   No results for input(s): LIPASE, AMYLASE in the last 168 hours. No results for input(s): AMMONIA in the last 168 hours. CBC:  Recent Labs Lab 05/16/16 0839 05/18/16 0536 05/19/16 0602  WBC 9.4 10.4 9.3  NEUTROABS 7.3  --   --   HGB 13.8 13.9 13.5  HCT 43.1 40.6 41.6  MCV 89.0 84.4 87.6  PLT 275 288 281   Cardiac Enzymes: No results for input(s): CKTOTAL, CKMB, CKMBINDEX, TROPONINI in the last 168 hours. BNP: Invalid input(s): POCBNP CBG:  Recent Labs Lab 05/20/16 0457  GLUCAP 145*   D-Dimer No results for input(s): DDIMER in the last 72 hours. Hgb A1c No results for input(s): HGBA1C in the last 72 hours. Lipid Profile No results for input(s): CHOL, HDL, LDLCALC, TRIG, CHOLHDL, LDLDIRECT in the last 72 hours. Thyroid function studies No results for input(s): TSH, T4TOTAL, T3FREE, THYROIDAB in the last 72 hours.  Invalid input(s): FREET3 Anemia work up No results for input(s): VITAMINB12, FOLATE, FERRITIN, TIBC, IRON, RETICCTPCT in the last 72 hours. Urinalysis    Component Value Date/Time   COLORURINE YELLOW 05/16/2016 0810   APPEARANCEUR CLOUDY (A) 05/16/2016 0810   LABSPEC 1.016 05/16/2016 0810   PHURINE 5.5 05/16/2016 0810   GLUCOSEU NEGATIVE 05/16/2016 0810   HGBUR MODERATE (A) 05/16/2016 0810   BILIRUBINUR  NEGATIVE 05/16/2016 0810   KETONESUR NEGATIVE 05/16/2016 0810   PROTEINUR NEGATIVE 05/16/2016 0810   NITRITE POSITIVE (A) 05/16/2016 0810   LEUKOCYTESUR MODERATE (A) 05/16/2016 0810   Sepsis Labs Invalid input(s): PROCALCITONIN,  WBC,  LACTICIDVEN Microbiology Recent Results (from the past 240 hour(s))  Culture, Urine     Status: Abnormal   Collection Time: 05/16/16  8:10 AM  Result Value Ref Range Status   Specimen Description URINE, CLEAN CATCH  Final   Special Requests NONE  Final   Culture >=100,000 COLONIES/mL ESCHERICHIA COLI (A)  Final   Report Status 05/19/2016 FINAL  Final   Organism ID, Bacteria ESCHERICHIA COLI (A)  Final      Susceptibility   Escherichia coli - MIC*    AMPICILLIN >=32 RESISTANT Resistant     CEFAZOLIN <=4 SENSITIVE Sensitive     CEFTRIAXONE <=1 SENSITIVE Sensitive     CIPROFLOXACIN <=0.25 SENSITIVE Sensitive     GENTAMICIN <=1 SENSITIVE Sensitive     IMIPENEM <=0.25 SENSITIVE Sensitive     NITROFURANTOIN <=16 SENSITIVE Sensitive     TRIMETH/SULFA <=20 SENSITIVE Sensitive     AMPICILLIN/SULBACTAM 16 INTERMEDIATE Intermediate     PIP/TAZO <=4 SENSITIVE Sensitive     Extended ESBL NEGATIVE Sensitive     * >=100,000 COLONIES/mL ESCHERICHIA COLI     SIGNED:   Jerald Kief, MD  [facility] 05/21/2016, 10:50 AM  If 7PM-7AM, please contact night-coverage [email or web address] Password [email or web address]

## 2016-05-21 NOTE — Care Management Note (Signed)
Case Management Note  Patient Details  Name: Modesto Charonllen Edmonston MRN: [id number] Date of Birth: 06/09/1930  Subjective/Objective:                    Action/Plan:   Expected Discharge Date:  05/18/16               Expected Discharge Plan:  Skilled Nursing Facility  In-House Referral:  Clinical Social Work  Discharge planning Services     Post Acute Care Choice:    Choice offered to:     DME Arranged:    DME Agency:     HH Arranged:    HH Agency:     Status of Service:  Completed, signed off  If discussed at [employer] Meetings, dates discussed:    Additional Comments: Quincy SheehanNicki Morton with [employer] notified of dc to [facility].  Golda Acreavis, Rhonda Lynn, RN 05/21/2016, 12:35 PM

## 2016-05-21 NOTE — Progress Notes (Signed)
Admissions Paperwork completed by Daughter-Tacy.  [facility] called for Transport.  Report Given to Nurse.  D/C summary Faxed.

## 2016-05-21 NOTE — Clinical Social Work Placement (Signed)
   CLINICAL SOCIAL WORK PLACEMENT  NOTE  Date:  05/21/2016  Patient Details  Name: Gwendolyn Brown MRN: [id number] Date of Birth: 10/05/1929  Clinical Social Work is seeking post-discharge placement for this patient at the Skilled  Nursing Facility level of care (*CSW will initial, date and re-position this form in  chart as items are completed):  Yes   Patient/family provided with [place] Clinical Social Work Department's list of facilities offering this level of care within the geographic area requested by the patient (or if unable, by the patient's family).  Yes   Patient/family informed of their freedom to choose among providers that offer the needed level of care, that participate in Medicare, Medicaid or managed care program needed by the patient, have an available bed and are willing to accept the patient.  Yes   Patient/family informed of [facility]'s ownership interest in [facility] and [facility], as well as of the fact that they are under no obligation to receive care at these facilities.  PASRR submitted to EDS on       PASRR number received on       Existing PASRR number confirmed on       FL2 transmitted to all facilities in geographic area requested by pt/family on       FL2 transmitted to all facilities within larger geographic area on       Patient informed that his/her managed care company has contracts with or will negotiate with certain facilities, including the following:  [facility]     Yes   Patient/family informed of bed offers received.  [facility]     Physician recommends and patient chooses bed at      [facility]  Patient to be transferred to facility by [facility]     Patient family notified on 05/21/16 of transfer.  Name of family member notified:  Daughter: Tacy     PHYSICIAN Please sign DNR     Additional Comment:     _______________________________________________ Clearance CootsNicole A Clarence Dunsmore, LCSW 05/21/2016, 11:31 AM

## 2016-05-21 NOTE — Care Management Note (Signed)
Case Management Note  Patient Details  Name: Gwendolyn Brown MRN: [id number] Date of Birth: 06/28/1930  Subjective/Objective:                    Action/Plan:   Expected Discharge Date:  05/18/16               Expected Discharge Plan:     In-House Referral:     Discharge planning Services     Post Acute Care Choice:    Choice offered to:     DME Arranged:    DME Agency:     HH Arranged:    HH Agency:     Status of Service:     If discussed at [employer] Meetings, dates discussed:    Additional Comments:  Golda AcreDavis, Rhonda Lynn, RN 05/21/2016, 12:18 PM

## 2016-08-02 ENCOUNTER — Telehealth: Payer: Self-pay | Admitting: Cardiology

## 2016-08-02 ENCOUNTER — Ambulatory Visit (INDEPENDENT_AMBULATORY_CARE_PROVIDER_SITE_OTHER): Admitting: *Deleted

## 2016-08-02 DIAGNOSIS — I495 Sick sinus syndrome: Secondary | ICD-10-CM

## 2016-08-02 NOTE — Telephone Encounter (Signed)
Confirmed remote transmission w/ pt daughter.   

## 2016-08-03 NOTE — Progress Notes (Signed)
Remote pacemaker transmission.   

## 2016-08-06 ENCOUNTER — Encounter: Payer: Self-pay | Admitting: Cardiology

## 2016-08-13 LAB — CUP PACEART REMOTE DEVICE CHECK
Battery Impedance: 679 Ohm
Date Time Interrogation Session: 20171211200341
Implantable Lead Implant Date: 20130123
Implantable Lead Location: 753859
Lead Channel Impedance Value: 67 Ohm
Lead Channel Setting Pacing Amplitude: 2.5 V
Lead Channel Setting Sensing Sensitivity: 4 mV
MDC IDC LEAD IMPLANT DT: 20130123
MDC IDC LEAD LOCATION: 753860
MDC IDC MSMT BATTERY REMAINING LONGEVITY: 68 mo
MDC IDC MSMT BATTERY VOLTAGE: 2.78 V
MDC IDC MSMT LEADCHNL RV IMPEDANCE VALUE: 481 Ohm
MDC IDC MSMT LEADCHNL RV PACING THRESHOLD AMPLITUDE: 0.625 V
MDC IDC MSMT LEADCHNL RV PACING THRESHOLD PULSEWIDTH: 0.4 ms
MDC IDC PG IMPLANT DT: 20130123
MDC IDC SET LEADCHNL RV PACING PULSEWIDTH: 0.4 ms
MDC IDC STAT BRADY RV PERCENT PACED: 100 %

## 2016-09-23 DEATH — deceased

## 2018-03-03 ENCOUNTER — Encounter: Payer: Self-pay | Admitting: Cardiology

## 2018-03-30 IMAGING — CR DG CHEST 2V
2 series · 2 of 2 positions shown · non-contrast
Comparison: 02/23/2016

CLINICAL DATA: Patient fell this morning. Complaining of of
increased weakness and dyspnea for the past 3 days.

EXAM:
CHEST  2 VIEW

[w chest lat]
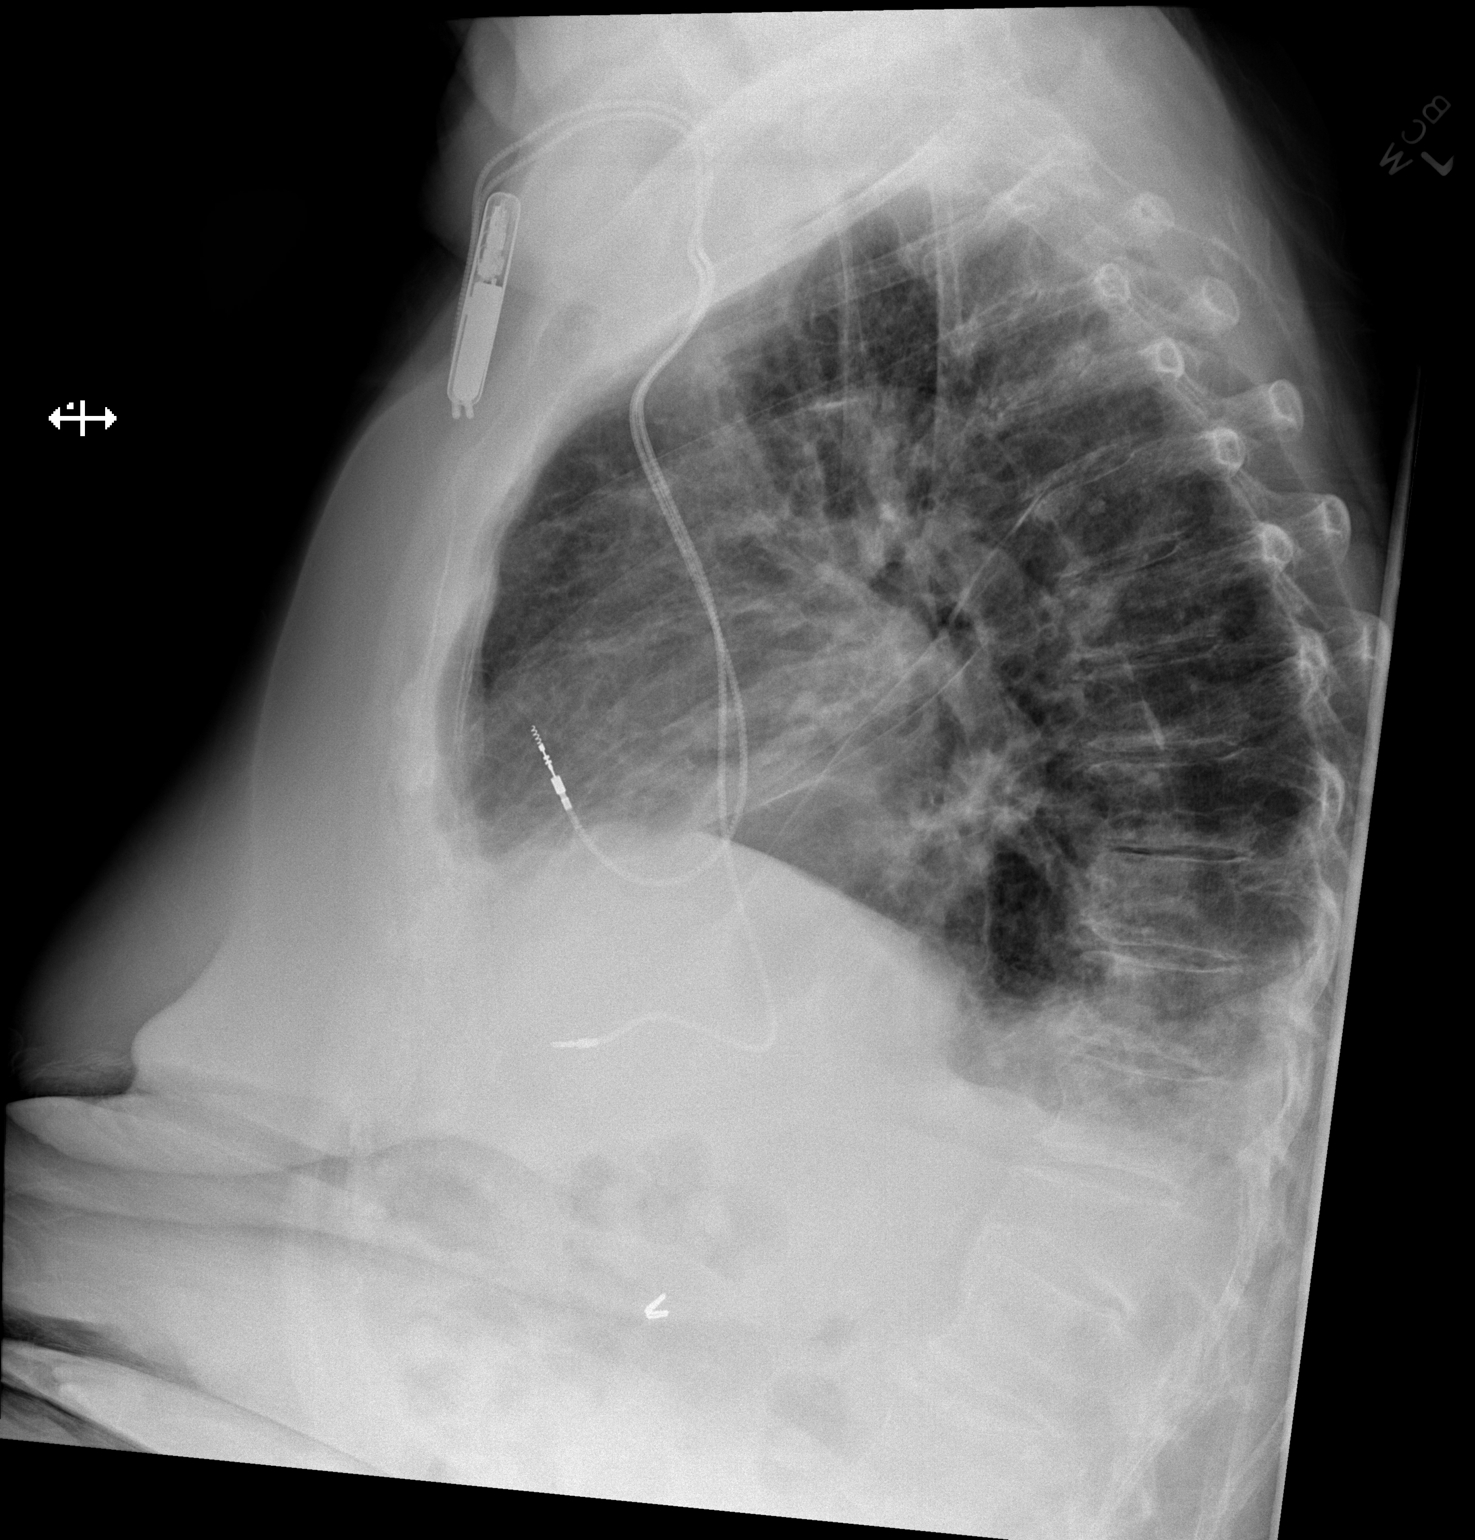

[x chest ap]
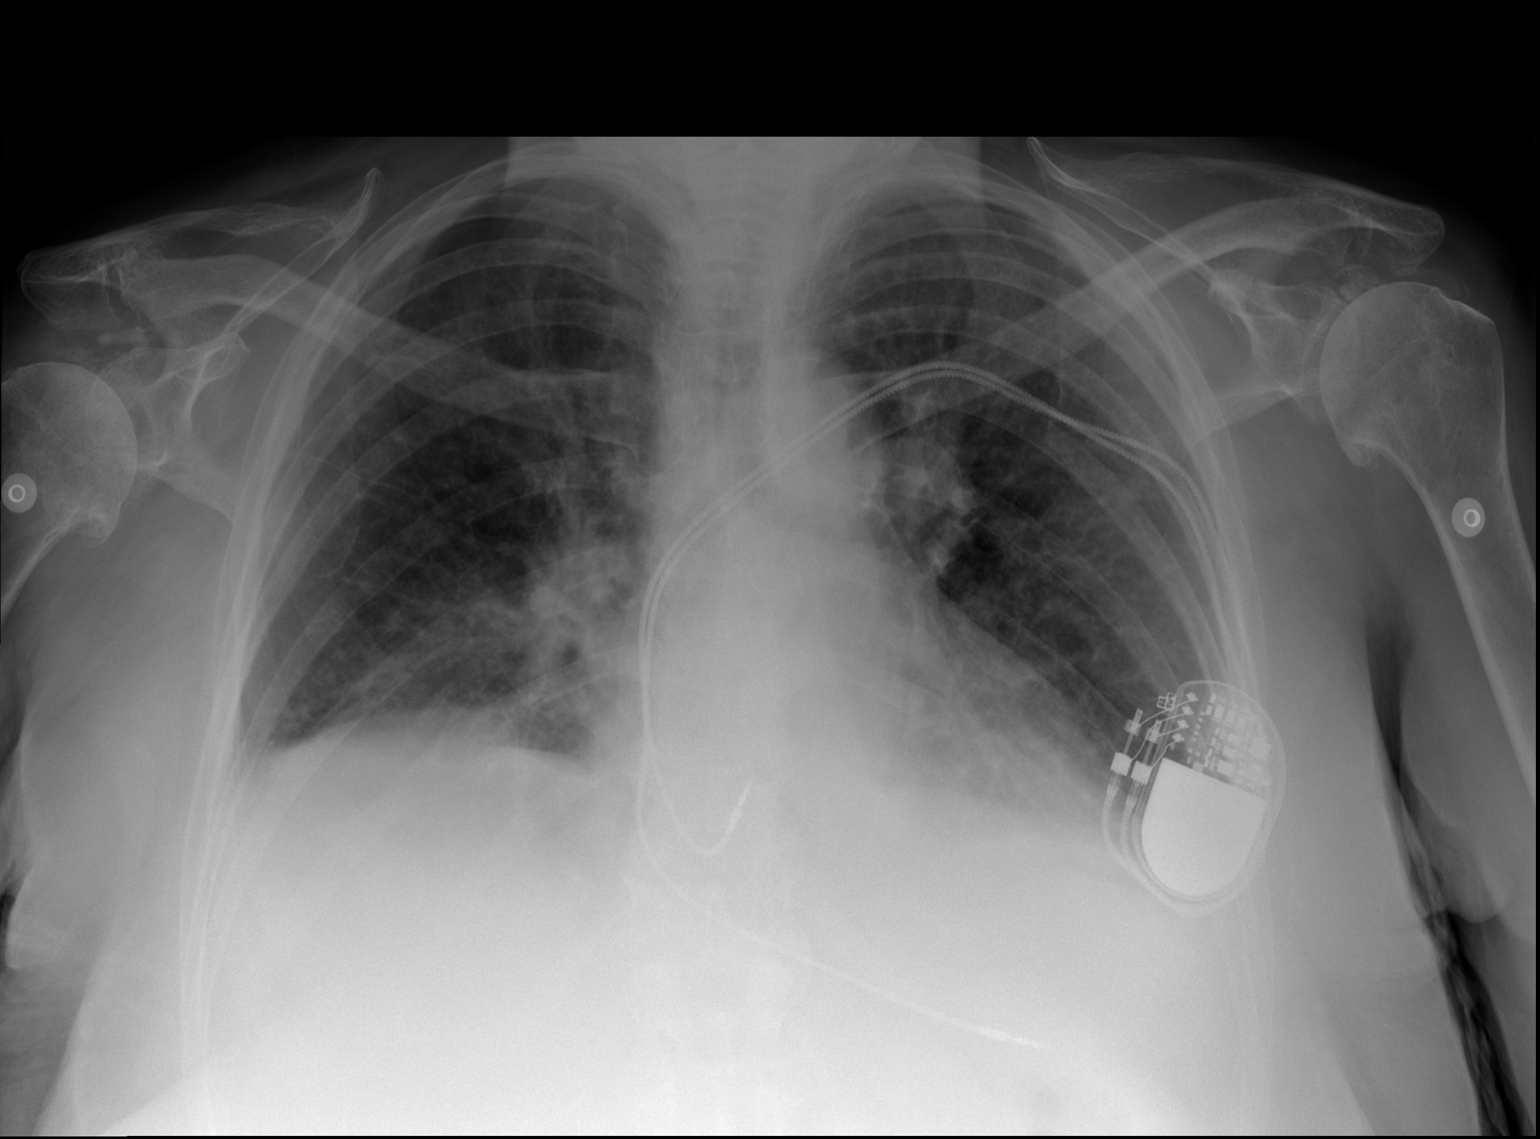

[2 of 2 positions shown; findings below may reference images not displayed]

FINDINGS: Lung volumes are low suggesting shallow inspiration. Crowding of the
interstitial lung markings and bibasilar atelectasis result. Slight
increasing airspace opacity at the right lung base cannot entirely
exclude a small infiltrate. No overt pulmonary edema nor effusion.

The heart is normal in size. Left-sided pacemaker apparatus projects
over the costophrenic angle with right atrial and right ventricular
leads in place. Aorta is not aneurysmal.

There is osteoarthritic joint space narrowing of both glenohumeral
joints with inferomedial osteophytes off the humeral heads.
Dystrophic calcifications are seen along the course of the rotator
cuff bilaterally.
IMPRESSION: Confluent area of airspace opacity at the right lung base for which
a small pneumonia cannot be entirely excluded but findings are
believed to be more likely from shallow inspiration and low lung
volumes.

Bilateral calcific rotator cuff tendinopathy.

## 2019-11-29 ENCOUNTER — Telehealth: Payer: Self-pay

## 2019-11-29 NOTE — Telephone Encounter (Signed)
Pt daughter called letting us know that her mom has passed away 09/14/2016.
